# Patient Record
Sex: Female | Born: 1940
Health system: Southern US, Community
[De-identification: ages and names within clinical notes are randomized; demographics above are authoritative.]

## PROBLEM LIST (undated history)

## (undated) DIAGNOSIS — E042 Nontoxic multinodular goiter: Secondary | ICD-10-CM

## (undated) DIAGNOSIS — B009 Herpesviral infection, unspecified: Secondary | ICD-10-CM

## (undated) DIAGNOSIS — F329 Major depressive disorder, single episode, unspecified: Secondary | ICD-10-CM

## (undated) DIAGNOSIS — I00 Rheumatic fever without heart involvement: Secondary | ICD-10-CM

## (undated) DIAGNOSIS — D219 Benign neoplasm of connective and other soft tissue, unspecified: Secondary | ICD-10-CM

## (undated) DIAGNOSIS — F419 Anxiety disorder, unspecified: Secondary | ICD-10-CM

## (undated) DIAGNOSIS — E785 Hyperlipidemia, unspecified: Secondary | ICD-10-CM

## (undated) DIAGNOSIS — F32A Depression, unspecified: Secondary | ICD-10-CM

## (undated) DIAGNOSIS — M8580 Other specified disorders of bone density and structure, unspecified site: Secondary | ICD-10-CM

## (undated) HISTORY — PX: WRIST FRACTURE SURGERY: SHX121

## (undated) HISTORY — DX: Depression, unspecified: F32.A

## (undated) HISTORY — DX: Benign neoplasm of connective and other soft tissue, unspecified: D21.9

## (undated) HISTORY — PX: PELVIC LAPAROSCOPY: SHX162

## (undated) HISTORY — PX: CATARACT EXTRACTION: SUR2

## (undated) HISTORY — DX: Rheumatic fever without heart involvement: I00

## (undated) HISTORY — DX: Herpesviral infection, unspecified: B00.9

## (undated) HISTORY — DX: Other specified disorders of bone density and structure, unspecified site: M85.80

## (undated) HISTORY — PX: TONSILLECTOMY: SUR1361

## (undated) HISTORY — DX: Anxiety disorder, unspecified: F41.9

## (undated) HISTORY — DX: Major depressive disorder, single episode, unspecified: F32.9

## (undated) HISTORY — DX: Nontoxic multinodular goiter: E04.2

---

## 2000-04-25 ENCOUNTER — Encounter: Payer: Self-pay | Admitting: Internal Medicine

## 2000-04-25 ENCOUNTER — Ambulatory Visit (HOSPITAL_COMMUNITY): Admission: RE | Admit: 2000-04-25 | Discharge: 2000-04-25 | Payer: Self-pay | Admitting: Internal Medicine

## 2001-01-17 ENCOUNTER — Encounter: Admission: RE | Admit: 2001-01-17 | Discharge: 2001-02-15 | Payer: Self-pay | Admitting: Neurosurgery

## 2001-04-28 ENCOUNTER — Encounter: Payer: Self-pay | Admitting: Internal Medicine

## 2001-04-28 ENCOUNTER — Ambulatory Visit (HOSPITAL_COMMUNITY): Admission: RE | Admit: 2001-04-28 | Discharge: 2001-04-28 | Payer: Self-pay | Admitting: Internal Medicine

## 2002-04-30 ENCOUNTER — Ambulatory Visit (HOSPITAL_COMMUNITY): Admission: RE | Admit: 2002-04-30 | Discharge: 2002-04-30 | Payer: Self-pay | Admitting: Internal Medicine

## 2002-04-30 ENCOUNTER — Encounter: Payer: Self-pay | Admitting: Internal Medicine

## 2002-05-01 ENCOUNTER — Encounter: Payer: Self-pay | Admitting: Internal Medicine

## 2002-05-01 ENCOUNTER — Encounter: Admission: RE | Admit: 2002-05-01 | Discharge: 2002-05-01 | Payer: Self-pay | Admitting: Internal Medicine

## 2003-05-06 ENCOUNTER — Encounter: Admission: RE | Admit: 2003-05-06 | Discharge: 2003-05-06 | Payer: Self-pay | Admitting: Internal Medicine

## 2004-05-11 ENCOUNTER — Encounter: Admission: RE | Admit: 2004-05-11 | Discharge: 2004-05-11 | Payer: Self-pay | Admitting: Geriatric Medicine

## 2005-05-03 ENCOUNTER — Encounter: Admission: RE | Admit: 2005-05-03 | Discharge: 2005-05-03 | Payer: Self-pay | Admitting: Geriatric Medicine

## 2005-05-12 ENCOUNTER — Encounter: Admission: RE | Admit: 2005-05-12 | Discharge: 2005-05-12 | Payer: Self-pay | Admitting: Geriatric Medicine

## 2006-04-26 ENCOUNTER — Encounter: Admission: RE | Admit: 2006-04-26 | Discharge: 2006-04-26 | Payer: Self-pay | Admitting: Geriatric Medicine

## 2006-08-18 ENCOUNTER — Encounter: Admission: RE | Admit: 2006-08-18 | Discharge: 2006-08-18 | Payer: Self-pay | Admitting: Geriatric Medicine

## 2007-03-23 ENCOUNTER — Encounter: Admission: RE | Admit: 2007-03-23 | Discharge: 2007-03-23 | Payer: Self-pay | Admitting: Dermatology

## 2007-06-26 ENCOUNTER — Encounter: Admission: RE | Admit: 2007-06-26 | Discharge: 2007-06-26 | Payer: Self-pay | Admitting: Geriatric Medicine

## 2007-08-07 ENCOUNTER — Encounter: Admission: RE | Admit: 2007-08-07 | Discharge: 2007-08-07 | Payer: Self-pay | Admitting: Geriatric Medicine

## 2007-08-14 ENCOUNTER — Encounter: Admission: RE | Admit: 2007-08-14 | Discharge: 2007-08-14 | Payer: Self-pay | Admitting: Geriatric Medicine

## 2007-08-21 ENCOUNTER — Other Ambulatory Visit: Admission: RE | Admit: 2007-08-21 | Discharge: 2007-08-21 | Payer: Self-pay | Admitting: Obstetrics and Gynecology

## 2007-08-24 ENCOUNTER — Encounter: Admission: RE | Admit: 2007-08-24 | Discharge: 2007-08-24 | Payer: Self-pay | Admitting: Interventional Radiology

## 2007-10-25 ENCOUNTER — Encounter: Admission: RE | Admit: 2007-10-25 | Discharge: 2007-10-25 | Payer: Self-pay | Admitting: Interventional Radiology

## 2007-11-01 ENCOUNTER — Encounter: Admission: RE | Admit: 2007-11-01 | Discharge: 2007-11-01 | Payer: Self-pay | Admitting: Interventional Radiology

## 2007-11-21 ENCOUNTER — Encounter: Admission: RE | Admit: 2007-11-21 | Discharge: 2007-11-21 | Payer: Self-pay | Admitting: Interventional Radiology

## 2007-11-29 ENCOUNTER — Encounter: Admission: RE | Admit: 2007-11-29 | Discharge: 2007-11-29 | Payer: Self-pay | Admitting: Interventional Radiology

## 2007-12-13 ENCOUNTER — Encounter: Admission: RE | Admit: 2007-12-13 | Discharge: 2007-12-13 | Payer: Self-pay | Admitting: Interventional Radiology

## 2007-12-20 ENCOUNTER — Encounter: Admission: RE | Admit: 2007-12-20 | Discharge: 2007-12-20 | Payer: Self-pay | Admitting: Interventional Radiology

## 2008-01-09 ENCOUNTER — Encounter: Admission: RE | Admit: 2008-01-09 | Discharge: 2008-01-09 | Payer: Self-pay | Admitting: Interventional Radiology

## 2008-03-28 ENCOUNTER — Ambulatory Visit (HOSPITAL_COMMUNITY): Admission: RE | Admit: 2008-03-28 | Discharge: 2008-03-28 | Payer: Self-pay | Admitting: Obstetrics and Gynecology

## 2008-06-18 ENCOUNTER — Encounter: Admission: RE | Admit: 2008-06-18 | Discharge: 2008-06-18 | Payer: Self-pay | Admitting: Dermatology

## 2008-07-01 ENCOUNTER — Encounter: Admission: RE | Admit: 2008-07-01 | Discharge: 2008-07-01 | Payer: Self-pay | Admitting: Geriatric Medicine

## 2008-08-02 ENCOUNTER — Encounter: Admission: RE | Admit: 2008-08-02 | Discharge: 2008-08-02 | Payer: Self-pay | Admitting: Geriatric Medicine

## 2009-06-19 ENCOUNTER — Other Ambulatory Visit: Admission: RE | Admit: 2009-06-19 | Discharge: 2009-06-19 | Payer: Self-pay | Admitting: Obstetrics and Gynecology

## 2010-02-01 ENCOUNTER — Emergency Department (HOSPITAL_COMMUNITY): Admission: EM | Admit: 2010-02-01 | Discharge: 2010-02-01 | Payer: Self-pay | Admitting: Emergency Medicine

## 2010-02-03 ENCOUNTER — Ambulatory Visit (HOSPITAL_BASED_OUTPATIENT_CLINIC_OR_DEPARTMENT_OTHER): Admission: RE | Admit: 2010-02-03 | Discharge: 2010-02-03 | Payer: Self-pay | Admitting: Orthopedic Surgery

## 2010-03-29 ENCOUNTER — Encounter: Payer: Self-pay | Admitting: Geriatric Medicine

## 2010-05-19 LAB — POCT I-STAT, CHEM 8
Chloride: 106 mEq/L (ref 96–112)
Creatinine, Ser: 0.7 mg/dL (ref 0.4–1.2)
Glucose, Bld: 109 mg/dL — ABNORMAL HIGH (ref 70–99)
Hemoglobin: 14.3 g/dL (ref 12.0–15.0)
Potassium: 3.5 mEq/L (ref 3.5–5.1)

## 2010-07-06 ENCOUNTER — Other Ambulatory Visit: Payer: Self-pay | Admitting: Geriatric Medicine

## 2010-07-06 DIAGNOSIS — Z1231 Encounter for screening mammogram for malignant neoplasm of breast: Secondary | ICD-10-CM

## 2010-07-22 ENCOUNTER — Ambulatory Visit
Admission: RE | Admit: 2010-07-22 | Discharge: 2010-07-22 | Disposition: A | Discharge status: Home / Self Care | Payer: Medicare Other | Source: Ambulatory Visit | Attending: Geriatric Medicine | Admitting: Geriatric Medicine

## 2010-07-22 DIAGNOSIS — Z1231 Encounter for screening mammogram for malignant neoplasm of breast: Secondary | ICD-10-CM

## 2010-11-30 ENCOUNTER — Encounter (INDEPENDENT_AMBULATORY_CARE_PROVIDER_SITE_OTHER): Payer: Medicare Other | Admitting: Ophthalmology

## 2010-11-30 DIAGNOSIS — H2510 Age-related nuclear cataract, unspecified eye: Secondary | ICD-10-CM

## 2010-11-30 DIAGNOSIS — H33309 Unspecified retinal break, unspecified eye: Secondary | ICD-10-CM

## 2010-11-30 DIAGNOSIS — H43819 Vitreous degeneration, unspecified eye: Secondary | ICD-10-CM

## 2011-01-01 ENCOUNTER — Other Ambulatory Visit: Payer: Self-pay | Admitting: Geriatric Medicine

## 2011-01-01 DIAGNOSIS — E041 Nontoxic single thyroid nodule: Secondary | ICD-10-CM

## 2011-01-14 ENCOUNTER — Ambulatory Visit
Admission: RE | Admit: 2011-01-14 | Discharge: 2011-01-14 | Disposition: A | Discharge status: Home / Self Care | Payer: Medicare Other | Source: Ambulatory Visit | Attending: Geriatric Medicine | Admitting: Geriatric Medicine

## 2011-01-14 DIAGNOSIS — E041 Nontoxic single thyroid nodule: Secondary | ICD-10-CM

## 2011-06-14 ENCOUNTER — Other Ambulatory Visit: Payer: Self-pay | Admitting: Geriatric Medicine

## 2011-06-14 DIAGNOSIS — Z1231 Encounter for screening mammogram for malignant neoplasm of breast: Secondary | ICD-10-CM

## 2011-08-04 ENCOUNTER — Other Ambulatory Visit: Payer: Self-pay | Admitting: Geriatric Medicine

## 2011-08-04 ENCOUNTER — Ambulatory Visit
Admission: RE | Admit: 2011-08-04 | Discharge: 2011-08-04 | Disposition: A | Discharge status: Home / Self Care | Payer: Medicare Other | Source: Ambulatory Visit | Attending: Geriatric Medicine | Admitting: Geriatric Medicine

## 2011-08-04 DIAGNOSIS — Z1231 Encounter for screening mammogram for malignant neoplasm of breast: Secondary | ICD-10-CM

## 2011-09-01 ENCOUNTER — Ambulatory Visit
Admission: RE | Admit: 2011-09-01 | Discharge: 2011-09-01 | Disposition: A | Discharge status: Home / Self Care | Payer: Medicare Other | Source: Ambulatory Visit | Attending: Geriatric Medicine | Admitting: Geriatric Medicine

## 2011-09-01 DIAGNOSIS — Z1231 Encounter for screening mammogram for malignant neoplasm of breast: Secondary | ICD-10-CM

## 2011-11-29 ENCOUNTER — Encounter (INDEPENDENT_AMBULATORY_CARE_PROVIDER_SITE_OTHER): Payer: Medicare Other | Admitting: Ophthalmology

## 2011-11-30 ENCOUNTER — Encounter (INDEPENDENT_AMBULATORY_CARE_PROVIDER_SITE_OTHER): Payer: Medicare Other | Admitting: Ophthalmology

## 2011-12-20 ENCOUNTER — Encounter (INDEPENDENT_AMBULATORY_CARE_PROVIDER_SITE_OTHER): Payer: Medicare Other | Admitting: Ophthalmology

## 2011-12-20 DIAGNOSIS — H33309 Unspecified retinal break, unspecified eye: Secondary | ICD-10-CM

## 2011-12-20 DIAGNOSIS — H2510 Age-related nuclear cataract, unspecified eye: Secondary | ICD-10-CM

## 2011-12-20 DIAGNOSIS — H2700 Aphakia, unspecified eye: Secondary | ICD-10-CM

## 2011-12-20 DIAGNOSIS — H43819 Vitreous degeneration, unspecified eye: Secondary | ICD-10-CM

## 2012-06-14 ENCOUNTER — Encounter: Payer: Self-pay | Admitting: Certified Nurse Midwife

## 2012-06-15 ENCOUNTER — Encounter: Payer: Self-pay | Admitting: Certified Nurse Midwife

## 2012-06-15 ENCOUNTER — Ambulatory Visit (INDEPENDENT_AMBULATORY_CARE_PROVIDER_SITE_OTHER): Payer: Medicare Other | Admitting: Certified Nurse Midwife

## 2012-06-15 VITALS — BP 100/68 | Ht 64.5 in | Wt 150.0 lb

## 2012-06-15 DIAGNOSIS — Z01419 Encounter for gynecological examination (general) (routine) without abnormal findings: Secondary | ICD-10-CM

## 2012-06-15 NOTE — Progress Notes (Signed)
72 y.o. MarriedCaucasian female   [id number] here for annual exam. Menopausal no HRT.  Denies any vaginal bleeding.  Using Olive Oil for vaginal dryness, works well.  Sees PCP for aex and lab work, all normal per patient. Sister with breast cancer has completed all treatment, doing great.  Went to counseling regarding genetic screening with her, declines having it done.  Had colonoscopy last year all normal. No health issues today.  Taking family and visiting [place]!  Patient's last menstrual period was 03/08/1992.          Sexually active: yes  The current method of family planning is vasectomy.    Exercising: yes  yoga, walking, elliptical & weight training Last mammogram: 09-08-11 Last pap: 10/12 Last BMD: 4/13 Alcohol: 5 a week Tobacco:none   Health Maintenance  Topic Date Due  . Colonoscopy  01/22/1993  . Zostavax  01/23/2003  . Pneumococcal Polysaccharide Vaccine Age 44 And Over  01/23/2008  . Influenza Vaccine  11/06/2012  . Mammogram  08/31/2013  . Tetanus/tdap  03/08/2016    Family History  Problem Relation Age of Onset  . Cancer Sister     ductal invasive cancer  . Cancer Maternal Grandmother     colon  . Cancer Father     lung (smoker)  . Depression Sister   . Osteoporosis Sister     There is no problem list on file for this patient.   Past Medical History  Diagnosis Date  . Multiple thyroid nodules     followed by pcp  . HSV-2 (herpes simplex virus 2) infection   . Anxiety   . Depression   . Rheumatic fever     no cardiology issues  . Fibroid   . Osteopenia     Past Surgical History  Procedure Laterality Date  . Wrist fracture surgery      Allergies: Latex  Current Outpatient Prescriptions  Medication Sig Dispense Refill  . alendronate (FOSAMAX) 70 MG tablet Take 70 mg by mouth every 7 (seven) days. Take with a full glass of water on an empty stomach.      . ALPRAZolam (XANAX) 0.5 MG tablet as needed.       Marland Kitchen aspirin 81 MG tablet Take 81 mg by  mouth daily.      . Calcium Carbonate (CALCIUM 600 PO) Take by mouth 2 (two) times daily.      . Cholecalciferol (VITAMIN D PO) Take by mouth daily.      . Coenzyme Q10 (CO Q 10 PO) Take by mouth daily.      . Iodine, Kelp, (KELP PO) Take by mouth daily.      . Multiple Vitamins-Minerals (MULTIVITAMIN PO) Take by mouth daily.      . Probiotic Product (PROBIOTIC PO) Take by mouth daily.      . TURMERIC PO Take by mouth daily.       No current facility-administered medications for this visit.    ROS: Pertinent items are noted in HPI.  Exam:    BP 100/68  Ht 5' 4.5" (1.638 m)  Wt 150 lb (68.04 kg)  BMI 25.36 kg/m2  LMP 03/08/1992 Weight change: @WEIGHTCHANGE @ Last 3 height recordings:  Ht Readings from Last 3 Encounters:  06/15/12 5' 4.5" (1.638 m)   General appearance: alert and cooperative Head: Normocephalic, without obvious abnormality, atraumatic Neck: no adenopathy, supple, symmetrical, trachea midline and thyroid not enlarged, symmetric, no tenderness/mass/nodules Lungs: clear to auscultation bilaterally Breasts: normal appearance, no masses or tenderness, No  nipple retraction or dimpling Heart: regular rate and rhythm Abdomen: soft, non-tender; bowel sounds normal; no masses,  no organomegaly Extremities: extremities normal, atraumatic, no cyanosis or edema Skin: Skin color, texture, turgor normal. No rashes or lesions Lymph nodes: Cervical, supraclavicular, and axillary nodes normal. no inguinal nodes palpated Neurologic: Grossly normal   Pelvic: External genitalia:  no lesions              Urethra: not indicated and normal appearing urethra with no masses, tenderness or lesions              Bartholins and Skenes: Bartholin's, Urethra, Skene's normal                 Vagina: normal appearing vagina with normal color and discharge, no lesions, atrophic, moist              Cervix: normal appearance              Pap taken: no        Bimanual Exam:  Uterus:  uterus is  normal size, shape, consistency and nontender                                      Adnexa:    normal adnexa in size, nontender and no masses                                      Rectovaginal: Confirms                                      Anus:  normal sphincter tone, no lesions  A: Well woman Exam Menopausal no HRT Osteoporosis on Fosamax managed by PCP Atrophic vaginitis uses Olive Oil with good response Family history of breast cancer: sister      P: Reviewed health and wellness pertinent to exam  Mammogram yearly, Pap smear as indicated Continue PCP follow up as indicated Declines any genetic information, went with sister for counseling  return annually or prn      An After Visit Summary was printed and given to the patient.  Reviewed, TL

## 2012-06-15 NOTE — Patient Instructions (Signed)

## 2012-06-20 ENCOUNTER — Other Ambulatory Visit: Payer: Self-pay | Admitting: Geriatric Medicine

## 2012-06-20 DIAGNOSIS — E041 Nontoxic single thyroid nodule: Secondary | ICD-10-CM

## 2012-08-14 ENCOUNTER — Other Ambulatory Visit: Payer: Self-pay

## 2012-08-14 DIAGNOSIS — Z1231 Encounter for screening mammogram for malignant neoplasm of breast: Secondary | ICD-10-CM

## 2012-08-21 ENCOUNTER — Ambulatory Visit
Admission: RE | Admit: 2012-08-21 | Discharge: 2012-08-21 | Disposition: A | Discharge status: Home / Self Care | Payer: Medicare Other | Source: Ambulatory Visit | Attending: Geriatric Medicine | Admitting: Geriatric Medicine

## 2012-08-21 DIAGNOSIS — E041 Nontoxic single thyroid nodule: Secondary | ICD-10-CM

## 2012-09-11 ENCOUNTER — Ambulatory Visit
Admission: RE | Admit: 2012-09-11 | Discharge: 2012-09-11 | Disposition: A | Discharge status: Home / Self Care | Payer: Medicare Other | Source: Ambulatory Visit

## 2012-09-11 DIAGNOSIS — Z1231 Encounter for screening mammogram for malignant neoplasm of breast: Secondary | ICD-10-CM

## 2012-12-20 ENCOUNTER — Ambulatory Visit (INDEPENDENT_AMBULATORY_CARE_PROVIDER_SITE_OTHER): Payer: Medicare Other | Admitting: Ophthalmology

## 2012-12-20 DIAGNOSIS — H43819 Vitreous degeneration, unspecified eye: Secondary | ICD-10-CM

## 2012-12-20 DIAGNOSIS — H25049 Posterior subcapsular polar age-related cataract, unspecified eye: Secondary | ICD-10-CM

## 2012-12-20 DIAGNOSIS — H33309 Unspecified retinal break, unspecified eye: Secondary | ICD-10-CM

## 2013-06-18 ENCOUNTER — Ambulatory Visit: Payer: Medicare Other | Admitting: Certified Nurse Midwife

## 2013-06-18 ENCOUNTER — Encounter: Payer: Self-pay | Admitting: Certified Nurse Midwife

## 2013-07-03 ENCOUNTER — Telehealth: Payer: Self-pay | Admitting: Certified Nurse Midwife

## 2013-07-03 NOTE — Telephone Encounter (Signed)
Pt calling to let DL know she had her annual physical with Dr.Stoneking and was told she did not need to have a gyn visit until later on in the year.

## 2013-07-03 NOTE — Telephone Encounter (Signed)
Brooke [name] CNM, patient last seen 06/15/2012 for AEX. No annual scheduled so far for this year. Had annual physical with Dr.Stoneking and was told did not need to be seen by gyn until later in the year. How would you like me to proceed with patient?

## 2013-07-04 NOTE — Telephone Encounter (Signed)
We normally see a patient once yearly, I think it is up to patient if she wants to postpone to later this year.  We are not prescribing any medications at least from last note. If patient is not having any problems I think it would be acceptable.

## 2013-07-05 NOTE — Telephone Encounter (Signed)
Left message to call Ledarius Leeson at [phone]. 

## 2013-07-20 NOTE — Telephone Encounter (Signed)
Left message to call Unita Detamore at [phone]. 

## 2013-07-20 NOTE — Telephone Encounter (Signed)
Patient is returning a call to Brooke [name] . °

## 2013-07-20 NOTE — Telephone Encounter (Signed)
Spoke with patient. Patient states that she is not having any problems or concerns at this time and would like to wait to come in for AEX. Advised that this is fine per Regina Eck CNM but if anything changes feel free to give [place] a call back so that we can get her an appointment. Patient agreeable and verbalizes understanding.  Routing to provider for final review. Patient agreeable to disposition. Will close encounter

## 2013-10-11 ENCOUNTER — Other Ambulatory Visit: Payer: Self-pay | Admitting: Gastroenterology

## 2013-12-13 ENCOUNTER — Other Ambulatory Visit: Payer: Self-pay

## 2013-12-13 DIAGNOSIS — Z1239 Encounter for other screening for malignant neoplasm of breast: Secondary | ICD-10-CM

## 2013-12-20 ENCOUNTER — Encounter (HOSPITAL_COMMUNITY): Payer: Self-pay | Admitting: Pharmacy Technician

## 2013-12-21 ENCOUNTER — Other Ambulatory Visit: Payer: Self-pay | Admitting: Gastroenterology

## 2013-12-24 ENCOUNTER — Ambulatory Visit
Admission: RE | Admit: 2013-12-24 | Discharge: 2013-12-24 | Disposition: A | Discharge status: Home / Self Care | Payer: Medicare Other | Source: Ambulatory Visit

## 2013-12-24 DIAGNOSIS — Z1239 Encounter for other screening for malignant neoplasm of breast: Secondary | ICD-10-CM

## 2013-12-25 ENCOUNTER — Encounter (HOSPITAL_COMMUNITY): Payer: Self-pay | Admitting: *Deleted

## 2014-01-07 ENCOUNTER — Encounter (HOSPITAL_COMMUNITY): Payer: Self-pay | Admitting: *Deleted

## 2014-01-08 ENCOUNTER — Ambulatory Visit (HOSPITAL_COMMUNITY)
Admission: RE | Admit: 2014-01-08 | Discharge: 2014-01-08 | Disposition: A | Discharge status: Home / Self Care | Payer: Medicare Other | Source: Ambulatory Visit | Attending: Gastroenterology | Admitting: Gastroenterology

## 2014-01-08 ENCOUNTER — Encounter (HOSPITAL_COMMUNITY)
Admission: RE | Disposition: A | Discharge status: Home / Self Care | Payer: Self-pay | Source: Ambulatory Visit | Attending: Gastroenterology

## 2014-01-08 ENCOUNTER — Ambulatory Visit (HOSPITAL_COMMUNITY): Payer: Medicare Other | Admitting: Anesthesiology

## 2014-01-08 ENCOUNTER — Encounter (HOSPITAL_COMMUNITY): Payer: Self-pay

## 2014-01-08 DIAGNOSIS — K562 Volvulus: Secondary | ICD-10-CM | POA: Diagnosis not present

## 2014-01-08 DIAGNOSIS — F419 Anxiety disorder, unspecified: Secondary | ICD-10-CM | POA: Insufficient documentation

## 2014-01-08 DIAGNOSIS — E042 Nontoxic multinodular goiter: Secondary | ICD-10-CM | POA: Insufficient documentation

## 2014-01-08 DIAGNOSIS — Z5309 Procedure and treatment not carried out because of other contraindication: Secondary | ICD-10-CM | POA: Insufficient documentation

## 2014-01-08 DIAGNOSIS — Z91040 Latex allergy status: Secondary | ICD-10-CM | POA: Insufficient documentation

## 2014-01-08 DIAGNOSIS — M810 Age-related osteoporosis without current pathological fracture: Secondary | ICD-10-CM | POA: Diagnosis not present

## 2014-01-08 DIAGNOSIS — E780 Pure hypercholesterolemia: Secondary | ICD-10-CM | POA: Insufficient documentation

## 2014-01-08 DIAGNOSIS — F329 Major depressive disorder, single episode, unspecified: Secondary | ICD-10-CM | POA: Insufficient documentation

## 2014-01-08 DIAGNOSIS — Z1211 Encounter for screening for malignant neoplasm of colon: Secondary | ICD-10-CM | POA: Insufficient documentation

## 2014-01-08 DIAGNOSIS — G4700 Insomnia, unspecified: Secondary | ICD-10-CM | POA: Diagnosis not present

## 2014-01-08 DIAGNOSIS — Z888 Allergy status to other drugs, medicaments and biological substances status: Secondary | ICD-10-CM | POA: Diagnosis not present

## 2014-01-08 HISTORY — PX: COLONOSCOPY WITH PROPOFOL: SHX5780

## 2014-01-08 SURGERY — COLONOSCOPY WITH PROPOFOL
Anesthesia: Monitor Anesthesia Care

## 2014-01-08 MED ORDER — LACTATED RINGERS IV SOLN
INTRAVENOUS | Status: DC
Start: 2014-01-08 — End: 2014-01-08
  Administered 2014-01-08: 1000 mL via INTRAVENOUS

## 2014-01-08 MED ORDER — PROPOFOL INFUSION 10 MG/ML OPTIME
INTRAVENOUS | Status: DC | PRN
  Administered 2014-01-08: 200 ug/kg/min via INTRAVENOUS

## 2014-01-08 MED ORDER — SODIUM CHLORIDE 0.9 % IV SOLN
INTRAVENOUS | Status: DC
Start: 2014-01-08 — End: 2014-01-08

## 2014-01-08 MED ORDER — PROPOFOL 10 MG/ML IV BOLUS
INTRAVENOUS | Status: AC
  Filled 2014-01-08: qty 20

## 2014-01-08 SURGICAL SUPPLY — 22 items

## 2014-01-08 NOTE — Anesthesia Postprocedure Evaluation (Signed)
  Anesthesia Post-op Note  Patient: Brooke [name]  Procedure(s) Performed: Procedure(s): COLONOSCOPY WITH PROPOFOL (N/A)  Patient Location: PACU  Anesthesia Type:MAC  Level of Consciousness: awake and alert   Airway and Oxygen Therapy: Patient Spontanous Breathing  Post-op Pain: none  Post-op Assessment: Post-op Vital signs reviewed, Patient's Cardiovascular Status Stable, Respiratory Function Stable, Patent Airway, No signs of Nausea or vomiting and Pain level controlled  Post-op Vital Signs: Reviewed and stable  Last Vitals:  Filed Vitals:   01/08/14 1040  BP:   Pulse: 71  Temp:   Resp: 13    Complications: No apparent anesthesia complications

## 2014-01-08 NOTE — H&P (Signed)
  Procedure: Screening colonoscopy. Normal screening colonoscopies performed on 04/10/2002 and 02/11/1995  History: The patient is a 73 year old female born June 04, 1940. She is scheduled to undergo a screening colonoscopy with polypectomy to prevent colon cancer.  Past medical history: Left wrist fracture surgery. Right eye cataract surgery. Repair of retinal hole. Tonsillectomy. Laparoscopic surgery for endometriosis. Insomnia. Ovarian cyst. Stable thyroid nodules. Hypercholesterolemia. Osteoporosis.  Allergies: Latex. Adhesive bandages.  Exam: The patient is alert and lying comfortably on the endoscopy stretcher. Abdomen is soft and nontender to palpation. Lungs are clear to auscultation. Cardiac exam reveals a regular rhythm.  Plan: Proceed with screening colonoscopy

## 2014-01-08 NOTE — Op Note (Signed)
Procedure: Screening flexible proctosigmoidoscopy to the splenic flexure. Colonic loop formation prevented completion of a screening colonoscopy.  Endoscopist: Earle Gell  Premedication: Propofol administered by anesthesia  Procedure: The patient was placed in the left lateral decubitus position. Anal inspection and digital rectal exam were normal. The Pentax pediatric colonoscope was introduced into the rectum and advanced to approximately the splenic flexure. Due to colonic loop formation I was unable to traverse the splenic flexure and complete a screening colonoscopy. The patient was repositioned from the left lateral decubitus position to the supine position and finally to the right lateral decubitus position applying external abdominal pressure which did not allow advancement of the colonoscope around the splenic flexure. Colonic preparation for the exam today was good.  Rectum. Normal. Retroflexed view of the distal rectum normal.  Sigmoid colon. Normal  Descending colon. Normal  The splenic flexure, transverse colon, hepatic flexure, ascending colon, cecum, and ileocecal valve were not examined.  Assessment: Normal screening flexible proctosigmoidoscopy  Recommendation: Schedule screening virtual colonoscopy

## 2014-01-08 NOTE — Anesthesia Preprocedure Evaluation (Signed)
Anesthesia Evaluation  Patient identified by MRN, date of birth, ID band Patient awake    Reviewed: Allergy & Precautions, H&P , NPO status   History of Anesthesia Complications Negative for: history of anesthetic complications  Airway Mallampati: I  TM Distance: >3 FB Neck ROM: Full    Dental  (+) Teeth Intact   Pulmonary neg pulmonary ROS,  breath sounds clear to auscultation        Cardiovascular negative cardio ROS  Rhythm:Regular     Neuro/Psych PSYCHIATRIC DISORDERS Anxiety Depression negative neurological ROS     GI/Hepatic negative GI ROS, Neg liver ROS,   Endo/Other  negative endocrine ROS  Renal/GU negative Renal ROS     Musculoskeletal   Abdominal   Peds  Hematology negative hematology ROS (+)   Anesthesia Other Findings   Reproductive/Obstetrics                             Anesthesia Physical Anesthesia Plan  ASA: II  Anesthesia Plan: MAC   Post-op Pain Management:    Induction: Intravenous  Airway Management Planned: Natural Airway and Simple Face Mask  Additional Equipment:   Intra-op Plan:   Post-operative Plan:   Informed Consent: I have reviewed the patients History and Physical, chart, labs and discussed the procedure including the risks, benefits and alternatives for the proposed anesthesia with the patient or authorized representative who has indicated his/her understanding and acceptance.   Dental advisory given  Plan Discussed with: CRNA and Surgeon  Anesthesia Plan Comments:         Anesthesia Quick Evaluation

## 2014-01-08 NOTE — Discharge Instructions (Signed)

## 2014-01-08 NOTE — Transfer of Care (Signed)
Immediate Anesthesia Transfer of Care Note  Patient: Brooke [name]  Procedure(s) Performed: Procedure(s): COLONOSCOPY WITH PROPOFOL (N/A)  Patient Location: PACU  Anesthesia Type:MAC  Level of Consciousness: awake  Airway & Oxygen Therapy: Patient Spontanous Breathing and Patient connected to nasal cannula oxygen  Post-op Assessment: Report given to PACU RN and Post -op Vital signs reviewed and stable  Post vital signs: Reviewed and stable  Complications: No apparent anesthesia complications

## 2014-01-09 ENCOUNTER — Ambulatory Visit (INDEPENDENT_AMBULATORY_CARE_PROVIDER_SITE_OTHER): Payer: Medicare Other | Admitting: Ophthalmology

## 2014-01-09 DIAGNOSIS — H43813 Vitreous degeneration, bilateral: Secondary | ICD-10-CM

## 2014-01-09 DIAGNOSIS — H33303 Unspecified retinal break, bilateral: Secondary | ICD-10-CM

## 2014-01-10 ENCOUNTER — Encounter (HOSPITAL_COMMUNITY): Payer: Self-pay | Admitting: Gastroenterology

## 2014-03-05 ENCOUNTER — Encounter (HOSPITAL_COMMUNITY): Payer: Self-pay | Admitting: Gastroenterology

## 2014-11-25 ENCOUNTER — Other Ambulatory Visit: Payer: Self-pay

## 2014-11-25 DIAGNOSIS — Z1231 Encounter for screening mammogram for malignant neoplasm of breast: Secondary | ICD-10-CM

## 2014-12-30 ENCOUNTER — Ambulatory Visit
Admission: RE | Admit: 2014-12-30 | Discharge: 2014-12-30 | Disposition: A | Discharge status: Home / Self Care | Payer: Medicare Other | Source: Ambulatory Visit

## 2014-12-30 DIAGNOSIS — Z1231 Encounter for screening mammogram for malignant neoplasm of breast: Secondary | ICD-10-CM

## 2015-01-27 ENCOUNTER — Ambulatory Visit
Admission: RE | Admit: 2015-01-27 | Discharge: 2015-01-27 | Disposition: A | Discharge status: Home / Self Care | Payer: Medicare Other | Source: Ambulatory Visit | Attending: Geriatric Medicine | Admitting: Geriatric Medicine

## 2015-01-27 ENCOUNTER — Other Ambulatory Visit: Payer: Self-pay | Admitting: Geriatric Medicine

## 2015-01-27 DIAGNOSIS — R059 Cough, unspecified: Secondary | ICD-10-CM

## 2015-01-27 DIAGNOSIS — R05 Cough: Secondary | ICD-10-CM

## 2015-02-05 ENCOUNTER — Ambulatory Visit (INDEPENDENT_AMBULATORY_CARE_PROVIDER_SITE_OTHER): Payer: Medicare Other | Admitting: Ophthalmology

## 2015-04-03 ENCOUNTER — Ambulatory Visit (INDEPENDENT_AMBULATORY_CARE_PROVIDER_SITE_OTHER): Payer: Medicare Other | Admitting: Ophthalmology

## 2015-04-03 DIAGNOSIS — H43813 Vitreous degeneration, bilateral: Secondary | ICD-10-CM

## 2015-04-03 DIAGNOSIS — H33303 Unspecified retinal break, bilateral: Secondary | ICD-10-CM | POA: Diagnosis not present

## 2015-04-03 DIAGNOSIS — H2512 Age-related nuclear cataract, left eye: Secondary | ICD-10-CM

## 2015-12-26 ENCOUNTER — Other Ambulatory Visit: Payer: Self-pay | Admitting: Geriatric Medicine

## 2015-12-26 DIAGNOSIS — Z1231 Encounter for screening mammogram for malignant neoplasm of breast: Secondary | ICD-10-CM

## 2016-02-02 ENCOUNTER — Encounter (INDEPENDENT_AMBULATORY_CARE_PROVIDER_SITE_OTHER): Payer: Self-pay

## 2016-02-02 ENCOUNTER — Ambulatory Visit
Admission: RE | Admit: 2016-02-02 | Discharge: 2016-02-02 | Disposition: A | Discharge status: Home / Self Care | Payer: Medicare Other | Source: Ambulatory Visit | Attending: Geriatric Medicine | Admitting: Geriatric Medicine

## 2016-02-02 DIAGNOSIS — Z1231 Encounter for screening mammogram for malignant neoplasm of breast: Secondary | ICD-10-CM

## 2016-04-08 ENCOUNTER — Ambulatory Visit (INDEPENDENT_AMBULATORY_CARE_PROVIDER_SITE_OTHER): Payer: Medicare Other | Admitting: Ophthalmology

## 2016-04-09 ENCOUNTER — Ambulatory Visit (INDEPENDENT_AMBULATORY_CARE_PROVIDER_SITE_OTHER): Payer: Medicare Other | Admitting: Ophthalmology

## 2016-04-26 ENCOUNTER — Ambulatory Visit (INDEPENDENT_AMBULATORY_CARE_PROVIDER_SITE_OTHER): Payer: Medicare Other | Admitting: Ophthalmology

## 2016-04-26 DIAGNOSIS — H43813 Vitreous degeneration, bilateral: Secondary | ICD-10-CM

## 2016-04-26 DIAGNOSIS — H33313 Horseshoe tear of retina without detachment, bilateral: Secondary | ICD-10-CM | POA: Diagnosis not present

## 2016-04-26 DIAGNOSIS — H35371 Puckering of macula, right eye: Secondary | ICD-10-CM

## 2016-05-27 ENCOUNTER — Ambulatory Visit: Payer: Medicare Other | Admitting: Certified Nurse Midwife

## 2016-06-02 ENCOUNTER — Ambulatory Visit: Payer: Medicare Other | Admitting: Certified Nurse Midwife

## 2016-12-30 ENCOUNTER — Other Ambulatory Visit: Payer: Self-pay | Admitting: Geriatric Medicine

## 2016-12-30 DIAGNOSIS — Z139 Encounter for screening, unspecified: Secondary | ICD-10-CM

## 2017-02-07 ENCOUNTER — Ambulatory Visit
Admission: RE | Admit: 2017-02-07 | Discharge: 2017-02-07 | Disposition: A | Discharge status: Home / Self Care | Payer: Medicare Other | Source: Ambulatory Visit | Attending: Geriatric Medicine | Admitting: Geriatric Medicine

## 2017-02-07 DIAGNOSIS — Z139 Encounter for screening, unspecified: Secondary | ICD-10-CM

## 2017-05-02 ENCOUNTER — Ambulatory Visit (INDEPENDENT_AMBULATORY_CARE_PROVIDER_SITE_OTHER): Payer: Medicare Other | Admitting: Ophthalmology

## 2017-05-16 ENCOUNTER — Encounter (INDEPENDENT_AMBULATORY_CARE_PROVIDER_SITE_OTHER): Payer: Medicare Other | Admitting: Ophthalmology

## 2017-05-16 DIAGNOSIS — H43813 Vitreous degeneration, bilateral: Secondary | ICD-10-CM | POA: Diagnosis not present

## 2017-05-16 DIAGNOSIS — H33303 Unspecified retinal break, bilateral: Secondary | ICD-10-CM | POA: Diagnosis not present

## 2017-05-16 DIAGNOSIS — H35371 Puckering of macula, right eye: Secondary | ICD-10-CM

## 2017-08-08 NOTE — Progress Notes (Signed)
[name] [address], [place] [address] Phone: [phone] Subjective:    I'm seeing this patient by the request  of:  Stoneking, Hal, MD   CC: right hip pain   [id number]  Brooke [name] is a 77 y.o. female coming in with complaint of right hip pain.She has been having chronic pain in her hip. Was diagnosed with low back pain by another provider. She does yoga 6 days a week. She feels that pigeon stretch helps her pain. She gets massage and does self pressure point relief with a ball. Denies any radiating symptoms. Pain is intermittent. Occurs mostly with transitions: getting up from chair, out of bed. Patient does have history of sciatic nerve pain 20 years ago. Patient does not remember any specific injury.      Past Medical History:  Diagnosis Date  . Anxiety   . Depression    no history of.  . Fibroid   . HSV-2 (herpes simplex virus 2) infection   . Multiple thyroid nodules    followed by pcp  . Osteopenia   . Rheumatic fever    no cardiology issues   Past Surgical History:  Procedure Laterality Date  . CATARACT EXTRACTION Right   . COLONOSCOPY WITH PROPOFOL N/A 01/08/2014   Procedure: COLONOSCOPY WITH PROPOFOL;  Surgeon: Garlan Fair, MD;  Location: [facility];  Service: Endoscopy;  Laterality: N/A;  . PELVIC LAPAROSCOPY     [name]  . TONSILLECTOMY    . WRIST FRACTURE SURGERY Left    '11-hardware retained   Social History   Socioeconomic History  . Marital status: Married    Spouse name: Not on file  . Number of children: Not on file  . Years of education: Not on file  . Highest education level: Not on file  Occupational History  . Not on file  Social Needs  . Financial resource strain: Not on file  . Food insecurity:    Worry: Not on file    Inability: Not on file  . Transportation needs:    Medical: Not on file    Non-medical: Not on file  Tobacco Use  . Smoking status: Never Smoker  Substance  and Sexual Activity  . Alcohol use: Yes    Alcohol/week: 2.5 oz    Types: 5 drink(s) per week    Comment: 2 wine glasses per day.  . Drug use: No  . Sexual activity: Yes    Partners: Male    Comment: husband vasectomy  Lifestyle  . Physical activity:    Days per week: Not on file    Minutes per session: Not on file  . Stress: Not on file  Relationships  . Social connections:    Talks on phone: Not on file    Gets together: Not on file    Attends religious service: Not on file    Active member of club or organization: Not on file    Attends meetings of clubs or organizations: Not on file    Relationship status: Not on file  Other Topics Concern  . Not on file  Social History Narrative  . Not on file   Allergies  Allergen Reactions  . Latex     Long period ... Red skin    Family History  Problem Relation Age of Onset  . Cancer Sister        ductal invasive cancer  . Breast cancer Sister 14  . Cancer Maternal  Grandmother        colon  . Cancer Father        lung (smoker)  . Depression Sister   . Osteoporosis Sister      Past medical history, social, surgical and family history all reviewed in electronic medical record.  No pertanent information unless stated regarding to the chief complaint.   Review of Systems:Review of systems updated and as accurate as of 08/09/17  No headache, visual changes, nausea, vomiting, diarrhea, constipation, dizziness, abdominal pain, skin rash, fevers, chills, night sweats, weight loss, swollen lymph nodes, body aches, joint swelling,  chest pain, shortness of breath, mood changes.  Mild positive muscle aches  Objective  Blood pressure 126/76, pulse 80, height 5\' 4"  (1.626 m), weight 137 lb (62.1 kg), last menstrual period 03/08/1992, SpO2 98 %. Systems examined below as of 08/09/17   General: No apparent distress alert and oriented x3 mood and affect normal, dressed appropriately.  HEENT: Pupils equal, extraocular movements intact    Respiratory: Patient's speak in full sentences and does not appear short of breath  Cardiovascular: No lower extremity edema, non tender, no erythema  Skin: Warm dry intact with no signs of infection or rash on extremities or on axial skeleton.  Abdomen: Soft nontender  Neuro: Cranial nerves II through XII are intact, neurovascularly intact in all extremities with 2+ DTRs and 2+ pulses.  Lymph: No lymphadenopathy of posterior or anterior cervical chain or axillae bilaterally.  Gait normal with good balance and coordination.  MSK:  Non tender with full range of motion and good stability and symmetric strength and tone of shoulders, elbows, wrist, hip, knee and ankles bilaterally.  Back Exam:  Inspection: Mild loss of lordosis Motion: Flexion 40 deg, Extension 25 deg, Side Bending to 35 deg bilaterally,  Rotation to 35 deg bilaterally  SLR laying: Negative  XSLR laying: Negative  Palpable tenderness: Tender to palpation in the paraspinal musculature lumbar spine. FABER: Positive Faber. Sensory change: Gross sensation intact to all lumbar and sacral dermatomes.  Reflexes: 2+ at both patellar tendons, 2+ at achilles tendons, Babinski's downgoing.  Strength at foot  Plantar-flexion: 5/5 Dorsi-flexion: 5/5 Eversion: 5/5 Inversion: 5/5  Leg strength  Quad: 5/5 Hamstring: 5/5 Hip flexor: 5/5 Hip abductors: 5/5  Gait unremarkable.   [id number]; 15 additional minutes spent for Therapeutic exercises as stated in above notes.  This included exercises focusing on stretching, strengthening, with significant focus on eccentric aspects.   Long term goals include an improvement in range of motion, strength, endurance as well as avoiding reinjury. Patient's frequency would include in 1-2 times a day, 3-5 times a week for a duration of 6-12 weeks. Sacroiliac Joint Mobilization and Rehab 1. Work on pretzel stretching, shoulder back and leg draped in front. 3-5 sets, 30 sec.. 2. hip abductor rotations. standing,  hip flexion and rotation outward then inward. 3 sets, 15 reps. when can do comfortably, add ankle weights starting at 2 pounds.  3. cross over stretching - shoulder back to ground, same side leg crossover. 3-5 sets for 30 min..  4. rolling up and back knees to chest and rocking. 5. sacral tilt - 5 sets, hold for 5-10 seconds  Proper technique shown and discussed handout in great detail with ATC.  All questions were discussed and answered.     Impression and Recommendations:     This case required medical decision making of moderate complexity.      Note: This dictation was prepared with Dragon dictation along with  smaller phrase technology. Any transcriptional errors that result from this process are unintentional.

## 2017-08-09 ENCOUNTER — Encounter

## 2017-08-09 ENCOUNTER — Ambulatory Visit: Payer: Medicare Other | Admitting: Family Medicine

## 2017-08-09 ENCOUNTER — Ambulatory Visit (INDEPENDENT_AMBULATORY_CARE_PROVIDER_SITE_OTHER)
Admission: RE | Admit: 2017-08-09 | Discharge: 2017-08-09 | Disposition: A | Discharge status: Home / Self Care | Payer: Medicare Other | Source: Ambulatory Visit | Attending: Family Medicine | Admitting: Family Medicine

## 2017-08-09 VITALS — BP 126/76 | HR 80 | Ht 64.0 in | Wt 137.0 lb

## 2017-08-09 DIAGNOSIS — M25551 Pain in right hip: Secondary | ICD-10-CM

## 2017-08-09 DIAGNOSIS — M533 Sacrococcygeal disorders, not elsewhere classified: Secondary | ICD-10-CM | POA: Insufficient documentation

## 2017-08-09 NOTE — Patient Instructions (Signed)
Good to see you  Ice 20 minutes 2 times daily. Usually after activity and before bed. pennsaid pinkie amount topically 2 times daily as needed. Or your voltaren gel  Xray downstairs.  Over the counter  Vitamin D 2000 Iu daily  Turmeric 500mg  daily  Tart cherry extract any dose at night Exercises 3 times a week.   Decrease the yoga to 4 times a week  OK to lift but only machines and 50% of weight you think you should do and increase 10% a week See me again in 4 weeks

## 2017-08-09 NOTE — Assessment & Plan Note (Signed)
Sacroiliac dysfunction.  Discussed icing regimen and home exercises.  Discussed which activities of doing which wants to avoid patient will do more hip abductor strengthening.  I do think that there is some underlying performance that is contributing as well.  No radicular symptoms.  Patient will start with conservative therapy including home exercises and topical anti-inflammatories and over-the-counter medications.  X-rays pending.  Follow-up again in 4 weeks

## 2017-09-03 NOTE — Progress Notes (Signed)
[name] [address], [place] [address] Phone: [phone] Subjective:     CC: Hip pain follow-up  SVX:[id number]  Brooke [name] is a 77 y.o. female coming in with complaint of hip pain. She is doing much better than last visit. Occasionally has pain in the morning when she arises. She has been doing her exercises. Does mention that she receives Prolia injections in case we did not know.  Patient is doing significantly better at this point.  Would state that she is 85 to 90% better.  Not having any significant pain.  Doing the exercises occasionally and feels like they have helped out.     Past Medical History:  Diagnosis Date  . Anxiety   . Depression    no history of.  . Fibroid   . HSV-2 (herpes simplex virus 2) infection   . Multiple thyroid nodules    followed by pcp  . Osteopenia   . Rheumatic fever    no cardiology issues   Past Surgical History:  Procedure Laterality Date  . CATARACT EXTRACTION Right   . COLONOSCOPY WITH PROPOFOL N/A 01/08/2014   Procedure: COLONOSCOPY WITH PROPOFOL;  Surgeon: Garlan Fair, MD;  Location: [facility];  Service: Endoscopy;  Laterality: N/A;  . PELVIC LAPAROSCOPY     [name]  . TONSILLECTOMY    . WRIST FRACTURE SURGERY Left    '11-hardware retained   Social History   Socioeconomic History  . Marital status: Married    Spouse name: Not on file  . Number of children: Not on file  . Years of education: Not on file  . Highest education level: Not on file  Occupational History  . Not on file  Social Needs  . Financial resource strain: Not on file  . Food insecurity:    Worry: Not on file    Inability: Not on file  . Transportation needs:    Medical: Not on file    Non-medical: Not on file  Tobacco Use  . Smoking status: Never Smoker  Substance and Sexual Activity  . Alcohol use: Yes    Alcohol/week: 3.0 oz    Types: 5 drink(s) per week    Comment: 2 wine glasses per day.    . Drug use: No  . Sexual activity: Yes    Partners: Male    Comment: husband vasectomy  Lifestyle  . Physical activity:    Days per week: Not on file    Minutes per session: Not on file  . Stress: Not on file  Relationships  . Social connections:    Talks on phone: Not on file    Gets together: Not on file    Attends religious service: Not on file    Active member of club or organization: Not on file    Attends meetings of clubs or organizations: Not on file    Relationship status: Not on file  Other Topics Concern  . Not on file  Social History Narrative  . Not on file   Allergies  Allergen Reactions  . Latex     Long period ... Red skin    Family History  Problem Relation Age of Onset  . Cancer Sister        ductal invasive cancer  . Breast cancer Sister 38  . Cancer Maternal Grandmother        colon  . Cancer Father        lung (smoker)  .  Depression Sister   . Osteoporosis Sister      Past medical history, social, surgical and family history all reviewed in electronic medical record.  No pertanent information unless stated regarding to the chief complaint.   Review of Systems:Review of systems updated and as accurate as of 09/03/17  No headache, visual changes, nausea, vomiting, diarrhea, constipation, dizziness, abdominal pain, skin rash, fevers, chills, night sweats, weight loss, swollen lymph nodes, body aches, joint swelling, muscle aches, chest pain, shortness of breath, mood changes.   Objective  Last menstrual period 03/08/1992. Systems examined below as of 09/03/17   General: No apparent distress alert and oriented x3 mood and affect normal, dressed appropriately.  HEENT: Pupils equal, extraocular movements intact  Respiratory: Patient's speak in full sentences and does not appear short of breath  Cardiovascular: No lower extremity edema, non tender, no erythema  Skin: Warm dry intact with no signs of infection or rash on extremities or on axial  skeleton.  Abdomen: Soft nontender  Neuro: Cranial nerves II through XII are intact, neurovascularly intact in all extremities with 2+ DTRs and 2+ pulses.  Lymph: No lymphadenopathy of posterior or anterior cervical chain or axillae bilaterally.  Gait normal with good balance and coordination.   MSK:  Non tender with full range of motion and good stability and symmetric strength and tone of shoulders, elbows, wrist, hip, knee and ankles bilaterally.  Mild to moderate arthritic changes of multiple joints  Back Exam:  Inspection: Loss of lordosis Motion: Flexion 45 deg, Extension 25 deg, Side Bending to 35 deg bilaterally,  Rotation to 35 deg bilaterally  SLR laying: Negative  XSLR laying: Negative  Palpable tenderness: Tender to palpation.  Mostly over the right SI joint FABER: Positive Corky Sox on the right. Sensory change: Gross sensation intact to all lumbar and sacral dermatomes.  Reflexes: 2+ at both patellar tendons, 2+ at achilles tendons, Babinski's downgoing.  Strength at foot  Plantar-flexion: 5/5 Dorsi-flexion: 5/5 Eversion: 5/5 Inversion: 5/5  Leg strength  Quad: 5/5 Hamstring: 5/5 Hip flexor: 5/5 Hip abductors: 4/5  Gait unremarkable.     Impression and Recommendations:     This case required medical decision making of moderate complexity.      Note: This dictation was prepared with Dragon dictation along with smaller phrase technology. Any transcriptional errors that result from this process are unintentional.

## 2017-09-05 ENCOUNTER — Ambulatory Visit: Payer: Medicare Other | Admitting: Family Medicine

## 2017-09-05 DIAGNOSIS — M533 Sacrococcygeal disorders, not elsewhere classified: Secondary | ICD-10-CM

## 2017-09-05 NOTE — Assessment & Plan Note (Signed)
Significant improvement at this time.  Discussed icing regimen and home exercise.  Discussed continuing the exercises as tolerated.  Follow-up with me as needed.  X-rays show very mild arthritic changes

## 2018-04-17 ENCOUNTER — Other Ambulatory Visit: Payer: Self-pay | Admitting: Geriatric Medicine

## 2018-04-17 DIAGNOSIS — Z1231 Encounter for screening mammogram for malignant neoplasm of breast: Secondary | ICD-10-CM

## 2018-05-10 ENCOUNTER — Encounter (INDEPENDENT_AMBULATORY_CARE_PROVIDER_SITE_OTHER): Payer: Self-pay

## 2018-05-10 ENCOUNTER — Ambulatory Visit
Admission: RE | Admit: 2018-05-10 | Discharge: 2018-05-10 | Disposition: A | Discharge status: Home / Self Care | Payer: Medicare Other | Source: Ambulatory Visit | Attending: Geriatric Medicine | Admitting: Geriatric Medicine

## 2018-05-10 DIAGNOSIS — Z1231 Encounter for screening mammogram for malignant neoplasm of breast: Secondary | ICD-10-CM

## 2018-05-22 ENCOUNTER — Encounter (INDEPENDENT_AMBULATORY_CARE_PROVIDER_SITE_OTHER): Payer: Medicare Other | Admitting: Ophthalmology

## 2018-07-13 ENCOUNTER — Telehealth: Payer: Self-pay

## 2018-07-13 NOTE — Telephone Encounter (Signed)
Scheduled patient for next week.

## 2018-07-13 NOTE — Telephone Encounter (Signed)
Left message for patient to call back to schedule for shoulder pain.

## 2018-07-20 ENCOUNTER — Ambulatory Visit: Payer: Self-pay

## 2018-07-20 ENCOUNTER — Ambulatory Visit (INDEPENDENT_AMBULATORY_CARE_PROVIDER_SITE_OTHER): Payer: Medicare Other | Admitting: Family Medicine

## 2018-07-20 VITALS — BP 122/64 | HR 87 | Ht 64.0 in

## 2018-07-20 DIAGNOSIS — M25512 Pain in left shoulder: Secondary | ICD-10-CM | POA: Diagnosis not present

## 2018-07-20 DIAGNOSIS — M24112 Other articular cartilage disorders, left shoulder: Secondary | ICD-10-CM

## 2018-07-20 DIAGNOSIS — G8929 Other chronic pain: Secondary | ICD-10-CM

## 2018-07-20 NOTE — Progress Notes (Signed)
[name] [address] [place], [place] [address] Phone: [phone] Subjective:   [name], am serving as a scribe for Dr. Hulan Saas.   CC: Left shoulder pain  [id number]  Brooke [name] is a 78 y.o. female coming in with complaint of left shoulder pain since falling off of the toilet at 4am on 05/16/2018.  Patient states that she has pain in posterior shoulder with circumduction and flexion. Does practice yoga has pain with shoulder rolls. Denies any radiating symptoms. Does not use NSAIDs for pain.  Patient has been doing relatively well but can be very uncomfortable at night and certain movements.  Patient denies any significant radiation down the arm or any significant neck pain associated with it.  Patient did have outside x-rays.  Independently visualized by me showing very mild osteoarthritic changes of both the glenohumeral and the acromioclavicular joint.     Past Medical History:  Diagnosis Date  . Anxiety   . Depression    no history of.  . Fibroid   . HSV-2 (herpes simplex virus 2) infection   . Multiple thyroid nodules    followed by pcp  . Osteopenia   . Rheumatic fever    no cardiology issues   Past Surgical History:  Procedure Laterality Date  . CATARACT EXTRACTION Right   . COLONOSCOPY WITH PROPOFOL N/A 01/08/2014   Procedure: COLONOSCOPY WITH PROPOFOL;  Surgeon: Garlan Fair, MD;  Location: [facility];  Service: Endoscopy;  Laterality: N/A;  . PELVIC LAPAROSCOPY     [name]  . TONSILLECTOMY    . WRIST FRACTURE SURGERY Left    '11-hardware retained   Social History   Socioeconomic History  . Marital status: Divorced    Spouse name: Not on file  . Number of children: Not on file  . Years of education: Not on file  . Highest education level: Not on file  Occupational History  . Not on file  Social Needs  . Financial resource strain: Not on file  . Food insecurity:    Worry: Not on file   Inability: Not on file  . Transportation needs:    Medical: Not on file    Non-medical: Not on file  Tobacco Use  . Smoking status: Never Smoker  Substance and Sexual Activity  . Alcohol use: Yes    Alcohol/week: 5.0 standard drinks    Types: 5 drink(s) per week    Comment: 2 wine glasses per day.  . Drug use: No  . Sexual activity: Yes    Partners: Male    Comment: husband vasectomy  Lifestyle  . Physical activity:    Days per week: Not on file    Minutes per session: Not on file  . Stress: Not on file  Relationships  . Social connections:    Talks on phone: Not on file    Gets together: Not on file    Attends religious service: Not on file    Active member of club or organization: Not on file    Attends meetings of clubs or organizations: Not on file    Relationship status: Not on file  Other Topics Concern  . Not on file  Social History Narrative  . Not on file   Allergies  Allergen Reactions  . Latex     Long period ... Red skin    Family History  Problem Relation Age of Onset  . Cancer Sister  ductal invasive cancer  . Breast cancer Sister 12  . Cancer Maternal Grandmother        colon  . Cancer Father        lung (smoker)  . Depression Sister   . Osteoporosis Sister     Current Outpatient Medications (Endocrine & Metabolic):  .  alendronate (FOSAMAX) 70 MG tablet, Take 70 mg by mouth every 7 (seven) days. Take with a full glass of water on an empty stomach.  Current Outpatient Medications (Cardiovascular):  .  rosuvastatin (CRESTOR) 5 MG tablet, Take 5 mg by mouth every morning.  Current Outpatient Medications (Respiratory):  Marland Kitchen  Fexofenadine HCl (MUCINEX ALLERGY PO), Take 800 mg by mouth every morning.  Current Outpatient Medications (Analgesics):  .  aspirin 81 MG tablet, Take 81 mg by mouth every morning.    Current Outpatient Medications (Other):  Marland Kitchen  ALPRAZolam (XANAX) 0.5 MG tablet, Take 0.5 mg by mouth daily as needed for sleep.  .   Calcium Carbonate (CALCIUM 600 PO), Take 1 tablet by mouth every morning.  .  Cholecalciferol (VITAMIN D PO), Take 1 tablet by mouth every morning.  .  Coenzyme Q10 (CO Q 10 PO), Take 1 tablet by mouth every morning.  .  Iodine, Kelp, (KELP PO)*, Take 1 tablet by mouth every morning.  .  Multiple Vitamins-Minerals (MULTIVITAMIN PO), Take 1 tablet by mouth every morning.  Marland Kitchen  OVER THE COUNTER MEDICATION, Take 2 capsules by mouth every morning. (Astraygalus) .  Probiotic Product (PROBIOTIC PO), Take 1 tablet by mouth every morning.  .  TURMERIC PO, Take 1 tablet by mouth every morning.  * These medications belong to multiple therapeutic classes and are listed under each applicable group.    Past medical history, social, surgical and family history all reviewed in electronic medical record.  No pertanent information unless stated regarding to the chief complaint.   Review of Systems:  No headache, visual changes, nausea, vomiting, diarrhea, constipation, dizziness, abdominal pain, skin rash, fevers, chills, night sweats, weight loss, swollen lymph nodes, body aches, joint swelling, muscle aches, chest pain, shortness of breath, mood changes.   Objective  Blood pressure 122/64, pulse 87, height 5\' 4"  (1.626 m), last menstrual period 03/08/1992, SpO2 99 %.    General: No apparent distress alert and oriented x3 mood and affect normal, dressed appropriately.  HEENT: Pupils equal, extraocular movements intact  Respiratory: Patient's speak in full sentences and does not appear short of breath  Cardiovascular: No lower extremity edema, non tender, no erythema  Skin: Warm dry intact with no signs of infection or rash on extremities or on axial skeleton.  Abdomen: Soft nontender  Neuro: Cranial nerves II through XII are intact, neurovascularly intact in all extremities with 2+ DTRs and 2+ pulses.  Lymph: No lymphadenopathy of posterior or anterior cervical chain or axillae bilaterally.  Gait normal  with good balance and coordination.  MSK:  tender with limited range of motion and good stability and symmetric strength and tone of  elbows, wrist, hip, knee and ankles bilaterally.  Significant arthritic changes of multiple joints Left shoulder exam shows some mild atrophy of the shoulder girdle is very close to the contralateral side.  Mild positive impingement signs but 5 out of 5 strength of the rotator cuff.  Mild positive crossover as well as O'Brien's.  Tenderness over the posterior aspect of the shoulder.  MSK US performed of: Left shoulder This study was ordered, performed, and interpreted by Charlann Boxer D.O.  Shoulder:   Supraspinatus:  Appears normal on long and transverse views, mild degenerative changes but no true tear Subscapularis:  Appears normal on long and transverse views. AC joint: Moderate arthritis but no synovitis Glenohumeral Joint: Moderate arthritis Glenoid Labrum: Patient does have a fairly large labral tear noted posteriorly. Biceps Tendon: Hypoechoic changes within the tendon sheath Impression: Likely large SLAP tear  97110; 15 additional minutes spent for Therapeutic exercises as stated in above notes.  This included exercises focusing on stretching, strengthening, with significant focus on eccentric aspects.   Long term goals include an improvement in range of motion, strength, endurance as well as avoiding reinjury. Patient's frequency would include in 1-2 times a day, 3-5 times a week for a duration of 6-12 weeks.  Shoulder Exercises that included:  Basic scapular stabilization to include adduction and depression of scapula Scaption, focusing on proper movement and good control Internal and External rotation utilizing a theraband, with elbow tucked at side entire time Rows with theraband which was given  Proper technique shown and discussed handout in great detail with ATC.  All questions were discussed and answered.     Impression and Recommendations:      This case required medical decision making of moderate complexity. The above documentation has been reviewed and is accurate and complete Lyndal Pulley, DO       Note: This dictation was prepared with Dragon dictation along with smaller phrase technology. Any transcriptional errors that result from this process are unintentional.

## 2018-07-20 NOTE — Patient Instructions (Signed)
Good to see you  Keep hands within peripheral vision.  Ice 20 minutes 2 times daily. Usually after activity and before bed. Exercises 3 times a week.  No lifting more then 20 pounds ever with that arm Listen to your body If it worsens we can do injection  See me in 6 weeks if not doing well

## 2018-07-20 NOTE — Assessment & Plan Note (Signed)
Patient does have more of a labral tear.  I discussed with patient in great length.  Patient though would like to try to do this conservatively.  Patient given home exercises and work with Product/process development scientist.  Discussed topical anti-inflammatories and icing regimen.  I believe patient will do well in the next day would be potential injections and formal physical therapy.  Follow-up again in 6 weeks

## 2018-08-31 ENCOUNTER — Ambulatory Visit: Payer: Medicare Other | Admitting: Family Medicine

## 2018-08-31 ENCOUNTER — Encounter: Payer: Self-pay | Admitting: Family Medicine

## 2018-08-31 ENCOUNTER — Other Ambulatory Visit: Payer: Self-pay

## 2018-08-31 ENCOUNTER — Ambulatory Visit: Payer: Self-pay

## 2018-08-31 VITALS — BP 130/74 | HR 69 | Ht 64.0 in | Wt 131.0 lb

## 2018-08-31 DIAGNOSIS — M7602 Gluteal tendinitis, left hip: Secondary | ICD-10-CM | POA: Diagnosis not present

## 2018-08-31 DIAGNOSIS — M24112 Other articular cartilage disorders, left shoulder: Secondary | ICD-10-CM | POA: Diagnosis not present

## 2018-08-31 DIAGNOSIS — M25552 Pain in left hip: Secondary | ICD-10-CM | POA: Diagnosis not present

## 2018-08-31 NOTE — Assessment & Plan Note (Signed)
Doing quite well with conservative therapy.  No significant change in management at this time.  As long as patient is well continue with conservative therapy.  Discussed certain range of motion such as reaching across the body or the possibility of severe external range of motion will cause more discomfort.

## 2018-08-31 NOTE — Patient Instructions (Signed)
Good to see you  Tried gluteal tendon injection New exercises today  See me again in 6 weeks if not better

## 2018-08-31 NOTE — Assessment & Plan Note (Signed)
Patient does have pain that seems to be more secondary to the sacroiliac joint but now seems to be more constant around the gluteal area.  Discussed with patient about home exercises, icing regimen, which activities to do which wants to avoid.  Patient is to increase activity after the injection slowly.  Discussed icing regimen and home exercises.  Discussed posture and ergonomics.  Follow-up again in 4 to 6 weeks.

## 2018-08-31 NOTE — Progress Notes (Signed)
[name] [place], [place] [address] Phone: [phone] Subjective:   I Brooke [name] am serving as a Education administrator for Dr. Hulan Saas.  I'm seeing this patient by the request  of:    CC:   [id number]   07/20/2018 Patient does have more of a labral tear.  I discussed with patient in great length.  Patient though would like to try to do this conservatively.  Patient given home exercises and work with Product/process development scientist.  Discussed topical anti-inflammatories and icing regimen.  I believe patient will do well in the next day would be potential injections and formal physical therapy.  Follow-up again in 6 weeks  08/31/2018 Brooke [name] is a 78 y.o. female coming in with complaint of left shoulder and left hip pain. Hip is doing worse and her shoulder is better.   Patient states that her shoulder pain seems to be doing approximately 90% better.  Only certain range of motion gives her some difficulty.  Rates the severity of pain is 8 out of 10 when this occurs.  States that only last seconds and then seems to go away. Patient does have what appears to be more painful when walking.  Seems to get better with rest.  Patient's insulin seems to be okay.  Points near the lateral gluteal area.  Denies any radiation down the leg.  Feels tighter than the other side.    Past Medical History:  Diagnosis Date  . Anxiety   . Depression    no history of.  . Fibroid   . HSV-2 (herpes simplex virus 2) infection   . Multiple thyroid nodules    followed by pcp  . Osteopenia   . Rheumatic fever    no cardiology issues   Past Surgical History:  Procedure Laterality Date  . CATARACT EXTRACTION Right   . COLONOSCOPY WITH PROPOFOL N/A 01/08/2014   Procedure: COLONOSCOPY WITH PROPOFOL;  Surgeon: Garlan Fair, MD;  Location: [facility];  Service: Endoscopy;  Laterality: N/A;  . PELVIC LAPAROSCOPY     [name]  . TONSILLECTOMY    . WRIST FRACTURE SURGERY  Left    '11-hardware retained   Social History   Socioeconomic History  . Marital status: Divorced    Spouse name: Not on file  . Number of children: Not on file  . Years of education: Not on file  . Highest education level: Not on file  Occupational History  . Not on file  Social Needs  . Financial resource strain: Not on file  . Food insecurity    Worry: Not on file    Inability: Not on file  . Transportation needs    Medical: Not on file    Non-medical: Not on file  Tobacco Use  . Smoking status: Never Smoker  Substance and Sexual Activity  . Alcohol use: Yes    Alcohol/week: 5.0 standard drinks    Types: 5 drink(s) per week    Comment: 2 wine glasses per day.  . Drug use: No  . Sexual activity: Yes    Partners: Male    Comment: husband vasectomy  Lifestyle  . Physical activity    Days per week: Not on file    Minutes per session: Not on file  . Stress: Not on file  Relationships  . Social Herbalist on phone: Not on file    Gets together: Not on file    Attends religious  service: Not on file    Active member of club or organization: Not on file    Attends meetings of clubs or organizations: Not on file    Relationship status: Not on file  Other Topics Concern  . Not on file  Social History Narrative  . Not on file   Allergies  Allergen Reactions  . Latex     Long period ... Red skin    Family History  Problem Relation Age of Onset  . Cancer Sister        ductal invasive cancer  . Breast cancer Sister 36  . Cancer Maternal Grandmother        colon  . Cancer Father        lung (smoker)  . Depression Sister   . Osteoporosis Sister     Current Outpatient Medications (Endocrine & Metabolic):  .  alendronate (FOSAMAX) 70 MG tablet, Take 70 mg by mouth every 7 (seven) days. Take with a full glass of water on an empty stomach.  Current Outpatient Medications (Cardiovascular):  .  rosuvastatin (CRESTOR) 5 MG tablet, Take 5 mg by mouth every  morning.  Current Outpatient Medications (Respiratory):  Marland Kitchen  Fexofenadine HCl (MUCINEX ALLERGY PO), Take 800 mg by mouth every morning.  Current Outpatient Medications (Analgesics):  .  aspirin 81 MG tablet, Take 81 mg by mouth every morning.    Current Outpatient Medications (Other):  Marland Kitchen  ALPRAZolam (XANAX) 0.5 MG tablet, Take 0.5 mg by mouth daily as needed for sleep.  .  Calcium Carbonate (CALCIUM 600 PO), Take 1 tablet by mouth every morning.  .  Cholecalciferol (VITAMIN D PO), Take 1 tablet by mouth every morning.  .  Coenzyme Q10 (CO Q 10 PO), Take 1 tablet by mouth every morning.  .  Iodine, Kelp, (KELP PO)*, Take 1 tablet by mouth every morning.  .  Multiple Vitamins-Minerals (MULTIVITAMIN PO), Take 1 tablet by mouth every morning.  Marland Kitchen  OVER THE COUNTER MEDICATION, Take 2 capsules by mouth every morning. (Astraygalus) .  Probiotic Product (PROBIOTIC PO), Take 1 tablet by mouth every morning.  .  TURMERIC PO, Take 1 tablet by mouth every morning.  * These medications belong to multiple therapeutic classes and are listed under each applicable group.    Past medical history, social, surgical and family history all reviewed in electronic medical record.  No pertanent information unless stated regarding to the chief complaint.   Review of Systems:  No headache, visual changes, nausea, vomiting, diarrhea, constipation, dizziness, abdominal pain, skin rash, fevers, chills, night sweats, weight loss, swollen lymph nodes, body aches, joint swelling,  chest pain, shortness of breath, mood changes.  Positive muscle aches  Objective  Blood pressure 130/74, pulse 69, height 5\' 4"  (1.626 m), weight 131 lb (59.4 kg), last menstrual period 03/08/1992, SpO2 98 %.     General: No apparent distress alert and oriented x3 mood and affect normal, dressed appropriately.  HEENT: Pupils equal, extraocular movements intact  Respiratory: Patient's speak in full sentences and does not appear short of  breath  Cardiovascular: No lower extremity edema, non tender, no erythema  Skin: Warm dry intact with no signs of infection or rash on extremities or on axial skeleton.  Abdomen: Soft nontender  Neuro: Cranial nerves II through XII are intact, neurovascularly intact in all extremities with 2+ DTRs and 2+ pulses.  Lymph: No lymphadenopathy of posterior or anterior cervical chain or axillae bilaterally.  Gait normal with good balance and coordination.  MSK:  Non tender with full range of motion and good stability and symmetric strength and tone of shoulders, elbows, wrist, , knee and ankles bilaterally.  Right shoulder No swelling, ecchymoses.  No gross deformity. No TTP. FROM.  Mild and still remain Negative Speeds, Yergasons. Strength 5/5 with empty can and resisted internal/external rotation. Negative apprehension. NV intact distally.  Left hip exam shows some significant tenderness of the left gluteal area.  Seems to be more at the musculotendinous junction.  Minimal pain over the piriformis.  Pain with resisted abduction of the hip.  No pain with hip flexion.  Full internal range of motion.  Procedure: Real-time Ultrasound Guided Injection of left gluteal tendon sheath Device: GE Logiq Q7 Ultrasound guided injection is preferred based studies that show increased duration, increased effect, greater accuracy, decreased procedural pain, increased response rate, and decreased cost with ultrasound guided versus blind injection.  Verbal informed consent obtained.  Time-out conducted.  Noted no overlying erythema, induration, or other signs of local infection.  Skin prepped in a sterile fashion.  Local anesthesia: Topical Ethyl chloride.  With sterile technique and under real time ultrasound guidance: With a 21-gauge 2 inch needle injected with 0.5 cc of 0.5% Marcaine and 1 cc of Kenalog 40 mg/mL into the musculotendinous junction of the gluteal tendon. Completed without difficulty  Pain  immediately resolved suggesting accurate placement of the medication.  Advised to call if fevers/chills, erythema, induration, drainage, or persistent bleeding.  Images permanently stored and available for review in the ultrasound unit.  Impression: Technically successful ultrasound guided injection.   Impression and Recommendations:     This case required medical decision making of moderate complexity. The above documentation has been reviewed and is accurate and complete Brooke Pulley, DO       Note: This dictation was prepared with Dragon dictation along with smaller phrase technology. Any transcriptional errors that result from this process are unintentional.

## 2019-01-02 ENCOUNTER — Encounter (INDEPENDENT_AMBULATORY_CARE_PROVIDER_SITE_OTHER): Payer: Medicare Other | Admitting: Ophthalmology

## 2019-01-11 ENCOUNTER — Encounter (INDEPENDENT_AMBULATORY_CARE_PROVIDER_SITE_OTHER): Payer: Medicare Other | Admitting: Ophthalmology

## 2019-01-11 ENCOUNTER — Other Ambulatory Visit: Payer: Self-pay

## 2019-01-11 DIAGNOSIS — H43813 Vitreous degeneration, bilateral: Secondary | ICD-10-CM | POA: Diagnosis not present

## 2019-01-11 DIAGNOSIS — H35371 Puckering of macula, right eye: Secondary | ICD-10-CM | POA: Diagnosis not present

## 2019-01-11 DIAGNOSIS — H33303 Unspecified retinal break, bilateral: Secondary | ICD-10-CM | POA: Diagnosis not present

## 2019-02-21 ENCOUNTER — Other Ambulatory Visit: Payer: Medicare Other

## 2019-02-22 ENCOUNTER — Ambulatory Visit: Payer: Medicare Other | Attending: Internal Medicine

## 2019-02-22 DIAGNOSIS — U071 COVID-19: Secondary | ICD-10-CM

## 2019-02-23 LAB — NOVEL CORONAVIRUS, NAA: SARS-CoV-2, NAA: NOT DETECTED

## 2019-03-18 ENCOUNTER — Ambulatory Visit: Payer: Medicare Other | Attending: Internal Medicine

## 2019-03-18 DIAGNOSIS — Z23 Encounter for immunization: Secondary | ICD-10-CM

## 2019-03-18 NOTE — Progress Notes (Signed)
   Covid-19 Vaccination Clinic  Name:  Brooke [name]    MRN: [id number] DOB: 05/13/1940  03/18/2019  [name] was observed post Covid-19 immunization for 15 minutes without incidence. She was provided with Vaccine Information Sheet and instruction to access the V-Safe system.   [name] was instructed to call 911 with any severe reactions post vaccine: Marland Kitchen Difficulty breathing  . Swelling of your face and throat  . A fast heartbeat  . A bad rash all over your body  . Dizziness and weakness    Immunizations Administered    Name Date Dose VIS Date Route   Pfizer COVID-19 Vaccine 03/18/2019 12:43 PM 0.3 mL 02/16/2019 Intramuscular   Manufacturer: [place]   Lot: [id number]   [place]: [id number]

## 2019-04-05 ENCOUNTER — Ambulatory Visit: Payer: Medicare Other

## 2019-04-07 ENCOUNTER — Ambulatory Visit: Payer: Medicare Other | Attending: Internal Medicine

## 2019-04-07 DIAGNOSIS — Z23 Encounter for immunization: Secondary | ICD-10-CM | POA: Insufficient documentation

## 2019-04-07 NOTE — Progress Notes (Signed)
   Covid-19 Vaccination Clinic  Name:  Brooke [name]    MRN: [id number] DOB: 15-Sep-1940  04/07/2019  Brooke [name] was observed post Covid-19 immunization for 15 minutes without incidence. She was provided with Vaccine Information Sheet and instruction to access the V-Safe system.   Brooke [name] was instructed to call 911 with any severe reactions post vaccine: Marland Kitchen Difficulty breathing  . Swelling of your face and throat  . A fast heartbeat  . A bad rash all over your body  . Dizziness and weakness    Immunizations Administered    Name Date Dose VIS Date Route   Pfizer COVID-19 Vaccine 04/07/2019 10:46 AM 0.3 mL 02/16/2019 Intramuscular   Manufacturer: [place]   Lot: [id number]   [place]: [id number]

## 2019-04-11 ENCOUNTER — Ambulatory Visit: Payer: Medicare Other | Admitting: Family Medicine

## 2019-04-11 ENCOUNTER — Other Ambulatory Visit: Payer: Self-pay

## 2019-04-11 ENCOUNTER — Ambulatory Visit (INDEPENDENT_AMBULATORY_CARE_PROVIDER_SITE_OTHER): Payer: Medicare Other

## 2019-04-11 ENCOUNTER — Encounter: Payer: Self-pay | Admitting: Family Medicine

## 2019-04-11 VITALS — BP 124/82 | HR 69 | Ht 64.0 in | Wt 131.0 lb

## 2019-04-11 DIAGNOSIS — M25511 Pain in right shoulder: Secondary | ICD-10-CM | POA: Diagnosis not present

## 2019-04-11 NOTE — Patient Instructions (Signed)
Keep hands in peripheral vision See me in 5-6 weeks

## 2019-04-11 NOTE — Assessment & Plan Note (Signed)
On exam today patient does have right shoulder pain with movement anything on ultrasound that shows a rotator cuff tear.  Patient does have some chronic degenerative changes which could be contributing.  Likely more of a sprain, home exercises given, icing regimen, topical anti-inflammatories, no significant change in medications follow-up again in 4 weeks.  Worsening pain consider injections formal physical therapy

## 2019-04-11 NOTE — Progress Notes (Signed)
[place] Phone: [phone] Subjective:   Brooke [name], am serving as a scribe for Dr. Hulan Saas. This visit occurred during the SARS-CoV-2 public health emergency.  Safety protocols were in place, including screening questions prior to the visit, additional usage of staff PPE, and extensive cleaning of exam room while observing appropriate contact time as indicated for disinfecting solutions.   I'm seeing this patient by the request  of:  Stoneking, Hal, MD  CC: Right shoulder pain  [id number]  Brooke [name] is a 79 y.o. female coming in with complaint of right arm pain. Last seen on 08/31/2018 for left hip pain. Pain for one week. Has been doing exercise video to get ready for bathing suit season. Pain in anterior shoulder has improved with pain at end ranges. Is going to start riding her horse next week and wants to make sure she is safe to do so.  Patient denies any nighttime pain.  States that initially had some decrease in range of motion but has been improving slowly already.      Past Medical History:  Diagnosis Date  . Anxiety   . Depression    [name] history of.  . Fibroid   . HSV-2 (herpes simplex virus 2) infection   . Multiple thyroid nodules    followed by pcp  . Osteopenia   . Rheumatic fever    [name] cardiology issues   Past Surgical History:  Procedure Laterality Date  . CATARACT EXTRACTION Right   . COLONOSCOPY WITH PROPOFOL N/A 01/08/2014   Procedure: COLONOSCOPY WITH PROPOFOL;  Surgeon: Garlan Fair, MD;  Location: [facility];  Service: Endoscopy;  Laterality: N/A;  . PELVIC LAPAROSCOPY     [name]  . TONSILLECTOMY    . WRIST FRACTURE SURGERY Left    '11-hardware retained   Social History   Socioeconomic History  . Marital status: Divorced    Spouse name: Not on file  . Number of children: Not on file  . Years of education: Not on file  . Highest education level:  Not on file  Occupational History  . Not on file  Tobacco Use  . Smoking status: Never Smoker  Substance and Sexual Activity  . Alcohol use: Yes    Alcohol/week: 5.0 standard drinks    Types: 5 drink(s) per week    Comment: 2 wine glasses per day.  . Drug use: [name]  . Sexual activity: Yes    Partners: Male    Comment: husband vasectomy  Other Topics Concern  . Not on file  Social History Narrative  . Not on file   Social Determinants of Health   Financial Resource Strain:   . Difficulty of Paying Living Expenses: Not on file  Food Insecurity:   . Worried About Charity fundraiser in the Last Year: Not on file  . Ran Out of Food in the Last Year: Not on file  Transportation Needs:   . Lack of Transportation (Medical): Not on file  . Lack of Transportation (Non-Medical): Not on file  Physical Activity:   . Days of Exercise per Week: Not on file  . Minutes of Exercise per Session: Not on file  Stress:   . Feeling of Stress : Not on file  Social Connections:   . Frequency of Communication with Friends and Family: Not on file  . Frequency of Social Gatherings with Friends and Family: Not on file  .  Attends Religious Services: Not on file  . Active Member of Clubs or Organizations: Not on file  . Attends Archivist Meetings: Not on file  . Marital Status: Not on file   Allergies  Allergen Reactions  . Latex     Long period ... Red skin    Family History  Problem Relation Age of Onset  . Cancer Sister        ductal invasive cancer  . Breast cancer Sister 28  . Cancer Maternal Grandmother        colon  . Cancer Father        lung (smoker)  . Depression Sister   . Osteoporosis Sister     Current Outpatient Medications (Endocrine & Metabolic):  .  alendronate (FOSAMAX) 70 MG tablet, Take 70 mg by mouth every 7 (seven) days. Take with a full glass of water on an empty stomach.  Current Outpatient Medications (Cardiovascular):  .  rosuvastatin (CRESTOR) 5  MG tablet, Take 5 mg by mouth every morning.  Current Outpatient Medications (Respiratory):  Marland Kitchen  Fexofenadine HCl (MUCINEX ALLERGY PO), Take 800 mg by mouth every morning.  Current Outpatient Medications (Analgesics):  .  aspirin 81 MG tablet, Take 81 mg by mouth every morning.    Current Outpatient Medications (Other):  Marland Kitchen  ALPRAZolam (XANAX) 0.5 MG tablet, Take 0.5 mg by mouth daily as needed for sleep.  .  Calcium Carbonate (CALCIUM 600 PO), Take 1 tablet by mouth every morning.  .  Cholecalciferol (VITAMIN D PO), Take 1 tablet by mouth every morning.  .  Coenzyme Q10 (CO Q 10 PO), Take 1 tablet by mouth every morning.  .  Iodine, Kelp, (KELP PO)*, Take 1 tablet by mouth every morning.  .  Multiple Vitamins-Minerals (MULTIVITAMIN PO), Take 1 tablet by mouth every morning.  Marland Kitchen  OVER THE COUNTER MEDICATION, Take 2 capsules by mouth every morning. (Astraygalus) .  Probiotic Product (PROBIOTIC PO), Take 1 tablet by mouth every morning.  .  TURMERIC PO, Take 1 tablet by mouth every morning.  * These medications belong to multiple therapeutic classes and are listed under each applicable group.   Reviewed prior external information including notes and imaging from  primary care provider As well as notes that were available from care everywhere and other healthcare systems.  Past medical history, social, surgical and family history all reviewed in electronic medical record.  [name] pertanent information unless stated regarding to the chief complaint.   Review of Systems:  [name] headache, visual changes, nausea, vomiting, diarrhea, constipation, dizziness, abdominal pain, skin rash, fevers, chills, night sweats, weight loss, swollen lymph nodes, body aches, joint swelling, chest pain, shortness of breath, mood changes. POSITIVE muscle aches  Objective  Blood pressure 124/82, pulse 69, height 5\' 4"  (1.626 m), weight 131 lb (59.4 kg), last menstrual period 03/08/1992, SpO2 98 %.   General: [name]  apparent distress alert and oriented x3 mood and affect normal, dressed appropriately.  HEENT: Pupils equal, extraocular movements intact  Respiratory: Patient's speak in full sentences and does not appear short of breath  Cardiovascular: [name] lower extremity edema, non tender, [name] erythema  Skin: Warm dry intact with [name] signs of infection or rash on extremities or on axial skeleton.  Abdomen: Soft nontender  Neuro: Cranial nerves II through XII are intact, neurovascularly intact in all extremities with 2+ DTRs and 2+ pulses.  Lymph: [name] lymphadenopathy of posterior or anterior cervical chain or axillae bilaterally.  Gait normal with good  balance and coordination.  MSK: Mild arthritic changes of multiple joints Right shoulder exam does have unfortunately some mild tenderness to palpation diffusely.  Patient does have active range of motion fully but does have pain greater than 90 degrees of forward flexion.  Patient does have positive impingement noted.  Rotator cuff strength is intact.  Does have some apprehension positive test as well.  Neurovascularly intact distally.  Limited musculoskeletal ultrasound was performed and interpreted by Lyndal Pulley  Limited ultrasound of patient's right shoulder shows that patient does have maybe some mild chronic tendinopathy of the supraspinatus but [name] true tear appreciated.  Moderate degenerative changes of the glenohumeral and the acromioclavicular joint.  Otherwise fairly unremarkable. Impression: Mild to moderate arthritic changes but [name] acute rotator cuff tear  [id number]; 15 additional minutes spent for Therapeutic exercises as stated in above notes.  This included exercises focusing on stretching, strengthening, with significant focus on eccentric aspects.   Long term goals include an improvement in range of motion, strength, endurance as well as avoiding reinjury. Patient's frequency would include in 1-2 times a day, 3-5 times a week for a duration of 6-12  weeks. Shoulder Exercises that included:  Basic scapular stabilization to include adduction and depression of scapula Scaption, focusing on proper movement and good control Internal and External rotation utilizing a theraband, with elbow tucked at side entire time Rows with theraband   Proper technique shown and discussed handout in great detail with ATC.  All questions were discussed and answered.     Impression and Recommendations:     This case required medical decision making of moderate complexity. The above documentation has been reviewed and is accurate and complete Lyndal Pulley, DO       Note: This dictation was prepared with Dragon dictation along with smaller phrase technology. Any transcriptional errors that result from this process are unintentional.

## 2019-04-17 ENCOUNTER — Ambulatory Visit: Payer: Medicare Other | Admitting: Family Medicine

## 2019-05-09 ENCOUNTER — Other Ambulatory Visit: Payer: Self-pay | Admitting: Geriatric Medicine

## 2019-05-09 DIAGNOSIS — Z1231 Encounter for screening mammogram for malignant neoplasm of breast: Secondary | ICD-10-CM

## 2019-05-23 ENCOUNTER — Ambulatory Visit: Payer: Medicare Other | Admitting: Family Medicine

## 2019-05-29 ENCOUNTER — Encounter: Payer: Self-pay | Admitting: Certified Nurse Midwife

## 2019-06-12 ENCOUNTER — Ambulatory Visit: Payer: Medicare Other

## 2019-06-14 ENCOUNTER — Ambulatory Visit: Payer: Medicare Other

## 2019-06-20 ENCOUNTER — Ambulatory Visit
Admission: RE | Admit: 2019-06-20 | Discharge: 2019-06-20 | Disposition: A | Discharge status: Home / Self Care | Payer: Medicare Other | Source: Ambulatory Visit | Attending: Geriatric Medicine | Admitting: Geriatric Medicine

## 2019-06-20 ENCOUNTER — Other Ambulatory Visit: Payer: Self-pay

## 2019-06-20 DIAGNOSIS — Z1231 Encounter for screening mammogram for malignant neoplasm of breast: Secondary | ICD-10-CM

## 2019-09-11 ENCOUNTER — Ambulatory Visit: Payer: Medicare Other | Admitting: Family Medicine

## 2019-09-11 NOTE — Progress Notes (Deleted)
[place] [address] [place] Phone: [phone] Subjective:    I'm seeing this patient by the request  of:  Lajean Manes, MD  CC:   [id number]  JAKITA DUTKIEWICZ is a 79 y.o. female coming in with complaint of ***  Onset-  Location Duration-  Character- Aggravating factors- Reliving factors-  Therapies tried-  Severity-     Past Medical History:  Diagnosis Date  . Anxiety   . Depression    no history of.  . Fibroid   . HSV-2 (herpes simplex virus 2) infection   . Multiple thyroid nodules    followed by pcp  . Osteopenia   . Rheumatic fever    no cardiology issues   Past Surgical History:  Procedure Laterality Date  . CATARACT EXTRACTION Right   . COLONOSCOPY WITH PROPOFOL N/A 01/08/2014   Procedure: COLONOSCOPY WITH PROPOFOL;  Surgeon: Garlan Fair, MD;  Location: [facility];  Service: Endoscopy;  Laterality: N/A;  . PELVIC LAPAROSCOPY     [name]  . TONSILLECTOMY    . WRIST FRACTURE SURGERY Left    '11-hardware retained   Social History   Socioeconomic History  . Marital status: Divorced    Spouse name: Not on file  . Number of children: Not on file  . Years of education: Not on file  . Highest education level: Not on file  Occupational History  . Not on file  Tobacco Use  . Smoking status: Never Smoker  Substance and Sexual Activity  . Alcohol use: Yes    Alcohol/week: 5.0 standard drinks    Types: 5 drink(s) per week    Comment: 2 wine glasses per day.  . Drug use: No  . Sexual activity: Yes    Partners: Male    Comment: husband vasectomy  Other Topics Concern  . Not on file  Social History Narrative  . Not on file   Social Determinants of Health   Financial Resource Strain:   . Difficulty of Paying Living Expenses:   Food Insecurity:   . Worried About Charity fundraiser in the Last Year:   . Arboriculturist in the Last Year:   Transportation Needs:   . Lexicographer (Medical):   Marland Kitchen Lack of Transportation (Non-Medical):   Physical Activity:   . Days of Exercise per Week:   . Minutes of Exercise per Session:   Stress:   . Feeling of Stress :   Social Connections:   . Frequency of Communication with Friends and Family:   . Frequency of Social Gatherings with Friends and Family:   . Attends Religious Services:   . Active Member of Clubs or Organizations:   . Attends Archivist Meetings:   Marland Kitchen Marital Status:    Allergies  Allergen Reactions  . Latex     Long period ... Red skin    Family History  Problem Relation Age of Onset  . Cancer Sister        ductal invasive cancer  . Breast cancer Sister 5  . Cancer Maternal Grandmother        colon  . Cancer Father        lung (smoker)  . Depression Sister   . Osteoporosis Sister     Current Outpatient Medications (Endocrine & Metabolic):  .  alendronate (FOSAMAX) 70 MG tablet, Take 70 mg by mouth every 7 (seven) days. Take with a full glass  of water on an empty stomach.  Current Outpatient Medications (Cardiovascular):  .  rosuvastatin (CRESTOR) 5 MG tablet, Take 5 mg by mouth every morning.  Current Outpatient Medications (Respiratory):  Marland Kitchen  Fexofenadine HCl (MUCINEX ALLERGY PO), Take 800 mg by mouth every morning.  Current Outpatient Medications (Analgesics):  .  aspirin 81 MG tablet, Take 81 mg by mouth every morning.    Current Outpatient Medications (Other):  Marland Kitchen  ALPRAZolam (XANAX) 0.5 MG tablet, Take 0.5 mg by mouth daily as needed for sleep.  .  Calcium Carbonate (CALCIUM 600 PO), Take 1 tablet by mouth every morning.  .  Cholecalciferol (VITAMIN D PO), Take 1 tablet by mouth every morning.  .  Coenzyme Q10 (CO Q 10 PO), Take 1 tablet by mouth every morning.  .  Iodine, Kelp, (KELP PO)*, Take 1 tablet by mouth every morning.  .  Multiple Vitamins-Minerals (MULTIVITAMIN PO), Take 1 tablet by mouth every morning.  Marland Kitchen  OVER THE COUNTER MEDICATION, Take 2 capsules  by mouth every morning. (Astraygalus) .  Probiotic Product (PROBIOTIC PO), Take 1 tablet by mouth every morning.  .  TURMERIC PO, Take 1 tablet by mouth every morning.  * These medications belong to multiple therapeutic classes and are listed under each applicable group.   Reviewed prior external information including notes and imaging from  primary care provider As well as notes that were available from care everywhere and other healthcare systems.  Past medical history, social, surgical and family history all reviewed in electronic medical record.  No pertanent information unless stated regarding to the chief complaint.   Review of Systems:  No headache, visual changes, nausea, vomiting, diarrhea, constipation, dizziness, abdominal pain, skin rash, fevers, chills, night sweats, weight loss, swollen lymph nodes, body aches, joint swelling, chest pain, shortness of breath, mood changes. POSITIVE muscle aches  Objective  Last menstrual period 03/08/1992.   General: No apparent distress alert and oriented x3 mood and affect normal, dressed appropriately.  HEENT: Pupils equal, extraocular movements intact  Respiratory: Patient's speak in full sentences and does not appear short of breath  Cardiovascular: No lower extremity edema, non tender, no erythema  Neuro: Cranial nerves II through XII are intact, neurovascularly intact in all extremities with 2+ DTRs and 2+ pulses.  Gait normal with good balance and coordination.  MSK:  Non tender with full range of motion and good stability and symmetric strength and tone of shoulders, elbows, wrist, hip, knee and ankles bilaterally.     Impression and Recommendations:     The above documentation has been reviewed and is accurate and complete Lyndal Pulley, DO       Note: This dictation was prepared with Dragon dictation along with smaller phrase technology. Any transcriptional errors that result from this process are unintentional.

## 2019-09-19 ENCOUNTER — Observation Stay (HOSPITAL_COMMUNITY)
Admission: EM | Admit: 2019-09-19 | Discharge: 2019-09-19 | Disposition: A | Discharge status: Home / Self Care | Payer: Medicare Other | Attending: Family Medicine | Admitting: Family Medicine

## 2019-09-19 ENCOUNTER — Observation Stay (HOSPITAL_BASED_OUTPATIENT_CLINIC_OR_DEPARTMENT_OTHER): Payer: Medicare Other

## 2019-09-19 ENCOUNTER — Other Ambulatory Visit: Payer: Self-pay | Admitting: Neurology

## 2019-09-19 ENCOUNTER — Emergency Department (HOSPITAL_COMMUNITY): Payer: Medicare Other

## 2019-09-19 ENCOUNTER — Encounter (HOSPITAL_COMMUNITY): Payer: Self-pay | Admitting: *Deleted

## 2019-09-19 ENCOUNTER — Observation Stay (HOSPITAL_COMMUNITY): Payer: Medicare Other

## 2019-09-19 ENCOUNTER — Other Ambulatory Visit: Payer: Self-pay

## 2019-09-19 DIAGNOSIS — I351 Nonrheumatic aortic (valve) insufficiency: Secondary | ICD-10-CM

## 2019-09-19 DIAGNOSIS — Z7982 Long term (current) use of aspirin: Secondary | ICD-10-CM | POA: Insufficient documentation

## 2019-09-19 DIAGNOSIS — F10929 Alcohol use, unspecified with intoxication, unspecified: Secondary | ICD-10-CM

## 2019-09-19 DIAGNOSIS — R29898 Other symptoms and signs involving the musculoskeletal system: Principal | ICD-10-CM | POA: Insufficient documentation

## 2019-09-19 DIAGNOSIS — I639 Cerebral infarction, unspecified: Secondary | ICD-10-CM | POA: Diagnosis not present

## 2019-09-19 DIAGNOSIS — F10920 Alcohol use, unspecified with intoxication, uncomplicated: Secondary | ICD-10-CM

## 2019-09-19 DIAGNOSIS — Z79899 Other long term (current) drug therapy: Secondary | ICD-10-CM | POA: Diagnosis not present

## 2019-09-19 DIAGNOSIS — M21332 Wrist drop, left wrist: Secondary | ICD-10-CM

## 2019-09-19 DIAGNOSIS — Z20822 Contact with and (suspected) exposure to covid-19: Secondary | ICD-10-CM | POA: Diagnosis not present

## 2019-09-19 DIAGNOSIS — F10129 Alcohol abuse with intoxication, unspecified: Secondary | ICD-10-CM | POA: Diagnosis not present

## 2019-09-19 DIAGNOSIS — Y906 Blood alcohol level of 120-199 mg/100 ml: Secondary | ICD-10-CM | POA: Insufficient documentation

## 2019-09-19 DIAGNOSIS — R260 Ataxic gait: Secondary | ICD-10-CM | POA: Insufficient documentation

## 2019-09-19 DIAGNOSIS — E785 Hyperlipidemia, unspecified: Secondary | ICD-10-CM

## 2019-09-19 DIAGNOSIS — E782 Mixed hyperlipidemia: Secondary | ICD-10-CM

## 2019-09-19 DIAGNOSIS — R270 Ataxia, unspecified: Secondary | ICD-10-CM

## 2019-09-19 HISTORY — DX: Hyperlipidemia, unspecified: E78.5

## 2019-09-19 LAB — URINALYSIS, ROUTINE W REFLEX MICROSCOPIC
Bilirubin Urine: NEGATIVE
Glucose, UA: NEGATIVE mg/dL
Hgb urine dipstick: NEGATIVE
Ketones, ur: NEGATIVE mg/dL
Leukocytes,Ua: NEGATIVE
Nitrite: NEGATIVE
Protein, ur: NEGATIVE mg/dL
Specific Gravity, Urine: 1.013 (ref 1.005–1.030)
pH: 5 (ref 5.0–8.0)

## 2019-09-19 LAB — APTT: aPTT: 28 seconds (ref 24–36)

## 2019-09-19 LAB — DIFFERENTIAL
Abs Immature Granulocytes: 0.01 10*3/uL (ref 0.00–0.07)
Basophils Absolute: 0 10*3/uL (ref 0.0–0.1)
Basophils Relative: 0 %
Eosinophils Absolute: 0.1 10*3/uL (ref 0.0–0.5)
Eosinophils Relative: 2 %
Immature Granulocytes: 0 %
Lymphocytes Relative: 50 %
Lymphs Abs: 2.5 10*3/uL (ref 0.7–4.0)
Monocytes Absolute: 0.3 10*3/uL (ref 0.1–1.0)
Monocytes Relative: 5 %
Neutro Abs: 2.2 10*3/uL (ref 1.7–7.7)
Neutrophils Relative %: 43 %

## 2019-09-19 LAB — COMPREHENSIVE METABOLIC PANEL
ALT: 30 U/L (ref 0–44)
AST: 34 U/L (ref 15–41)
Albumin: 5.5 g/dL — ABNORMAL HIGH (ref 3.5–5.0)
Alkaline Phosphatase: 26 U/L — ABNORMAL LOW (ref 38–126)
Anion gap: 15 (ref 5–15)
BUN: 15 mg/dL (ref 8–23)
CO2: 24 mmol/L (ref 22–32)
Calcium: 10.2 mg/dL (ref 8.9–10.3)
Chloride: 101 mmol/L (ref 98–111)
Creatinine, Ser: 0.67 mg/dL (ref 0.44–1.00)
GFR calc Af Amer: >60 mL/min (ref >60)
GFR calc non Af Amer: >60 mL/min (ref >60)
Glucose, Bld: 104 mg/dL — ABNORMAL HIGH (ref 70–99)
Potassium: 4 mmol/L (ref 3.5–5.1)
Sodium: 140 mmol/L (ref 135–145)
Total Bilirubin: 0.7 mg/dL (ref 0.3–1.2)
Total Protein: 8.1 g/dL (ref 6.5–8.1)

## 2019-09-19 LAB — ECHOCARDIOGRAM COMPLETE
Height: 65 in
Weight: 1952 oz

## 2019-09-19 LAB — CBC
HCT: 45.3 % (ref 36.0–46.0)
Hemoglobin: 14.9 g/dL (ref 12.0–15.0)
MCH: 32 pg (ref 26.0–34.0)
MCHC: 32.9 g/dL (ref 30.0–36.0)
MCV: 97.2 fL (ref 80.0–100.0)
Platelets: 194 10*3/uL (ref 150–400)
RBC: 4.66 MIL/uL (ref 3.87–5.11)
RDW: 12.7 % (ref 11.5–15.5)
WBC: 5 10*3/uL (ref 4.0–10.5)
nRBC: 0 % (ref 0.0–0.2)

## 2019-09-19 LAB — LIPID PANEL
Cholesterol: 262 mg/dL — ABNORMAL HIGH (ref 0–200)
HDL: 95 mg/dL (ref >40)
LDL Cholesterol: 135 mg/dL — ABNORMAL HIGH (ref 0–99)
Total CHOL/HDL Ratio: 2.8 RATIO
Triglycerides: 161 mg/dL — ABNORMAL HIGH (ref <150)
VLDL: 32 mg/dL (ref 0–40)

## 2019-09-19 LAB — SARS CORONAVIRUS 2 BY RT PCR (HOSPITAL ORDER, PERFORMED IN ~~LOC~~ HOSPITAL LAB): SARS Coronavirus 2: NEGATIVE

## 2019-09-19 LAB — HEMOGLOBIN A1C
Hgb A1c MFr Bld: 5.3 % (ref 4.8–5.6)
Mean Plasma Glucose: 105.41 mg/dL

## 2019-09-19 LAB — I-STAT CHEM 8, ED
BUN: 16 mg/dL (ref 8–23)
Calcium, Ion: 1.16 mmol/L (ref 1.15–1.40)
Chloride: 103 mmol/L (ref 98–111)
Creatinine, Ser: 0.9 mg/dL (ref 0.44–1.00)
Glucose, Bld: 101 mg/dL — ABNORMAL HIGH (ref 70–99)
HCT: 40 % (ref 36.0–46.0)
Hemoglobin: 13.6 g/dL (ref 12.0–15.0)
Potassium: 4.2 mmol/L (ref 3.5–5.1)
Sodium: 143 mmol/L (ref 135–145)
TCO2: 24 mmol/L (ref 22–32)

## 2019-09-19 LAB — CBG MONITORING, ED: Glucose-Capillary: 94 mg/dL (ref 70–99)

## 2019-09-19 LAB — PROTIME-INR
INR: 0.9 (ref 0.8–1.2)
Prothrombin Time: 11.5 seconds (ref 11.4–15.2)

## 2019-09-19 LAB — ETHANOL: Alcohol, Ethyl (B): 197 mg/dL — ABNORMAL HIGH (ref <10)

## 2019-09-19 LAB — RAPID URINE DRUG SCREEN, HOSP PERFORMED
Amphetamines: NOT DETECTED
Barbiturates: NOT DETECTED
Benzodiazepines: NOT DETECTED
Cocaine: NOT DETECTED
Opiates: NOT DETECTED
Tetrahydrocannabinol: NOT DETECTED

## 2019-09-19 MED ORDER — ROSUVASTATIN CALCIUM 5 MG PO TABS: 10.0000 mg | ORAL_TABLET | Freq: Every day | ORAL | 4 refills | Status: AC

## 2019-09-19 MED ORDER — SODIUM CHLORIDE 0.9% FLUSH
3.0000 mL | Freq: Once | INTRAVENOUS | Status: AC
  Administered 2019-09-19: 3 mL via INTRAVENOUS

## 2019-09-19 MED ORDER — ACETAMINOPHEN 325 MG PO TABS
650.0000 mg | ORAL_TABLET | Freq: Four times a day (QID) | ORAL | Status: DC | PRN
  Administered 2019-09-19: 650 mg via ORAL
  Filled 2019-09-19: qty 2

## 2019-09-19 MED ORDER — IOHEXOL 350 MG/ML SOLN
100.0000 mL | Freq: Once | INTRAVENOUS | Status: AC | PRN
  Administered 2019-09-19: 100 mL via INTRAVENOUS

## 2019-09-19 NOTE — Progress Notes (Signed)
°  Echocardiogram 2D Echocardiogram has been performed.  Brooke [name] 09/19/2019, 2:01 PM

## 2019-09-19 NOTE — H&P (Signed)
History and Physical    ANNTONETTE [name] TDV:[id number] DOB: 1941/03/08 DOA: 09/19/2019  PCP: Lajean Manes, MD   Patient coming from:    Home  Chief Complaint: Left hand weakness, presents as code stroke  HPI: Brooke [name] is a 79 y.o. female with medical history significant for hyperlipidemia, osteopenia who presents by EMS as a code stroke.  Patient lives alone.  Last time of known normal was around 10 PM.  Patient reports that she was sitting in her chair when she fell asleep and woke up around 2 AM and noticed that her left hand was slightly numb and very weak.  She states she had difficulty using her left hand.  She initially thought that her arm just fell asleep but when it did not improve in a few minutes she decided to call 911.  She states she did not have any drooping face, slurred speech, change in vision, numbness or weakness in her extremities.  She states she had no syncope and did not have any falls at home.  He denies having any chest pain, palpitations, abdominal pain, nausea, vomiting, diarrhea, urinary symptoms, fever, cough, shortness of breath.  She reports that she actually had her routine physical exam yesterday morning with her PCP was told she was in good health. Patient reports she does not use tobacco products or illicit drugs.  She states she have approximately 4-5 alcoholic drinks a week.   ED Course: The emergency room patient continues to have left hand weakness and difficulty flexing her wrist and gripping with her left hand.  She was seen by neurology who felt that patient probably has a right lacunar infarct and recommended further work-up for CVA.  CT angiography of the head and neck showed no large vessel occlusions and no acute stroke.  Alcohol level was elevated at 197 in the emergency room.  Review of Systems:  General: Denies fever, chills, weight loss, night sweats.  Denies dizziness.  Denies change in appetite HENT: Denies head trauma, headache,  denies change in hearing, tinnitus.  Denies nasal congestion or bleeding.  Denies sore throat, sores in mouth.  Denies difficulty swallowing Eyes: Denies blurry vision, pain in eye, drainage.  Denies discoloration of eyes. Neck: Denies pain.  Denies swelling.  Denies pain with movement. Cardiovascular: Denies chest pain, palpitations.  Denies edema.  Denies orthopnea Respiratory: Denies shortness of breath, cough.  Denies wheezing.  Denies sputum production Gastrointestinal: Denies abdominal pain, swelling.  Denies nausea, vomiting, diarrhea.  Denies melena.  Denies hematemesis. Musculoskeletal: Denies limitation of movement.  Denies deformity or swelling.  Denies pain.  Denies arthralgias or myalgias. Genitourinary: Denies pelvic pain.  Denies urinary frequency or hesitancy.  Denies dysuria.  Skin: Denies rash.  Denies petechiae, purpura, ecchymosis. Neurological: Denies headache.  Denies syncope.  Denies seizure activity.  Denies weakness or paresthesia.  Denies slurred speech, drooping face.  Denies visual change. Psychiatric: Denies depression, anxiety.  Denies suicidal thoughts or ideation.  Denies hallucinations.  Past Medical History:  Diagnosis Date  . Anxiety   . Depression    no history of.  . Fibroid   . HSV-2 (herpes simplex virus 2) infection   . Hyperlipemia   . Multiple thyroid nodules    followed by pcp  . Osteopenia   . Rheumatic fever    no cardiology issues    Past Surgical History:  Procedure Laterality Date  . CATARACT EXTRACTION Right   . COLONOSCOPY WITH PROPOFOL N/A 01/08/2014   Procedure: COLONOSCOPY  WITH PROPOFOL;  Surgeon: Garlan Fair, MD;  Location: [facility];  Service: Endoscopy;  Laterality: N/A;  . PELVIC LAPAROSCOPY     [name]  . TONSILLECTOMY    . WRIST FRACTURE SURGERY Left    '11-hardware retained    Social History  reports that she has never smoked. She has never used smokeless tobacco. She reports current alcohol use of about 5.0  standard drinks of alcohol per week. She reports that she does not use drugs.  Allergies  Allergen Reactions  . Latex     Long period ... Red skin     Family History  Problem Relation Age of Onset  . Cancer Sister        ductal invasive cancer  . Breast cancer Sister 2  . Cancer Maternal Grandmother        colon  . Cancer Father        lung (smoker)  . Depression Sister   . Osteoporosis Sister      Prior to Admission medications   Medication Sig Start Date End Date Taking? Authorizing Provider  ALPRAZolam Duanne Moron) 0.5 MG tablet Take 0.5 mg by mouth daily as needed for sleep.  03/17/12  Yes [provider]  calcium-vitamin D (OSCAL WITH D) 500-200 MG-UNIT tablet Take 1 tablet by mouth 2 (two) times daily.   Yes [provider]  Cholecalciferol (VITAMIN D) 50 MCG (2000 UT) tablet Take 2,000 Units by mouth daily.   Yes [provider]  clobetasol cream (TEMOVATE) 8.78 % Apply 1 application topically 2 (two) times daily as needed (for rash).   Yes [provider]  denosumab (PROLIA) 60 MG/ML SOSY injection Inject 60 mg into the skin every 6 (six) months.   Yes [provider]  diclofenac Sodium (VOLTAREN) 1 % GEL Apply 2 g topically 4 (four) times daily as needed (for pain).   Yes [provider]  estradiol (ESTRACE) 0.1 MG/GM vaginal cream Place 1 Applicatorful vaginally 2 (two) times a week.   Yes [provider]  guaiFENesin (MUCINEX) 600 MG 12 hr tablet Take 600 mg by mouth 2 (two) times daily as needed for cough.   Yes [provider]  Kelp 100 MG TABS Take 100 mg by mouth daily.   Yes [provider]  loratadine (CLARITIN) 10 MG tablet Take 10 mg by mouth daily as needed for allergies.   Yes [provider]  melatonin 3 MG TABS tablet Take 3 mg by mouth at bedtime.   Yes [provider]  Misc Natural Products (COSAMIN ASU ADVANCED FORMULA PO) Take 1 tablet by mouth daily.   Yes  [provider]  Misc Natural Products (TART CHERRY ADVANCED PO) Take 1 tablet by mouth daily.   Yes [provider]  naproxen sodium (ALEVE) 220 MG tablet Take 220 mg by mouth 2 (two) times daily as needed (for pain).   Yes [provider]  rosuvastatin (CRESTOR) 5 MG tablet Take 5 mg by mouth daily.    Yes [provider]  Turmeric 500 MG TABS Take 500 mg by mouth daily.   Yes [provider]    Physical Exam: Vitals:   09/19/19 0354 09/19/19 0420 09/19/19 0530  BP:  125/64 131/62  Pulse:  91 96  Resp:  17 (!) 22  Temp:  98 F (36.7 C)   TempSrc:  Oral   SpO2:  98% 97%  Weight: 55.3 kg    Height: 5\' 5"  (1.651 m)  Constitutional: NAD, calm, comfortable Vitals:   09/19/19 0354 09/19/19 0420 09/19/19 0530  BP:  125/64 131/62  Pulse:  91 96  Resp:  17 (!) 22  Temp:  98 F (36.7 C)   TempSrc:  Oral   SpO2:  98% 97%  Weight: 55.3 kg    Height: 5\' 5"  (1.651 m)     General: WDWN, Alert and oriented x3.  Eyes: EOMI, PERRL, lids and conjunctivae normal.  Sclera nonicteric.  No nystagmus HENT:  [place]/AT, external ears normal.  Nares patent without epistasis.  Mucous membranes are moist. Posterior pharynx clear of any exudate or lesions. Normal dentition.  Neck: Soft, normal range of motion, supple, no masses, no thyromegaly.  Trachea midline Respiratory: clear to auscultation bilaterally, no wheezing, no crackles. Normal respiratory effort. No accessory muscle use.  Cardiovascular: Regular rate and rhythm, no murmurs / rubs / gallops. No extremity edema. 2+ pedal pulses. No carotid bruits.  Abdomen: Soft, no tenderness, nondistended, no rebound or guarding.  No masses palpated. No hepatosplenomegaly. Bowel sounds normoactive Musculoskeletal: FROM. no clubbing / cyanosis. No joint deformity upper and lower extremities. no contractures. Normal muscle tone.  Skin: Warm, dry, intact no rashes, lesions, ulcers. No induration Neurologic: CN  2-12 grossly intact.  Normal speech.  Sensation intact, patella DTR +1 bilaterally. Strength 5/5 in both lower extremities.  Right arm and hand strength 5 out of 5.  Left forearm and hand strength 4 out of 5 with weakened flexion of left wrist.  Tongue with normal extension without deviation. Psychiatric: Normal judgment and insight.  Normal mood.    Labs on Admission: I have personally reviewed following labs and imaging studies  CBC: Recent Labs  Lab 09/19/19 0339 09/19/19 0422  WBC  --  5.0  NEUTROABS  --  2.2  HGB 13.6 14.9  HCT 40.0 45.3  MCV  --  97.2  PLT  --  597    Basic Metabolic Panel: Recent Labs  Lab 09/19/19 0339 09/19/19 0422  NA 143 140  K 4.2 4.0  CL 103 101  CO2  --  24  GLUCOSE 101* 104*  BUN 16 15  CREATININE 0.90 0.67  CALCIUM  --  10.2    GFR: Estimated Creatinine Clearance: 52.2 mL/min (by C-G formula based on SCr of 0.67 mg/dL).  Liver Function Tests: Recent Labs  Lab 09/19/19 0422  AST 34  ALT 30  ALKPHOS 26*  BILITOT 0.7  PROT 8.1  ALBUMIN 5.5*    Urine analysis:    Component Value Date/Time   COLORURINE STRAW (A) 09/19/2019 0428   APPEARANCEUR CLEAR 09/19/2019 0428   LABSPEC 1.013 09/19/2019 0428   PHURINE 5.0 09/19/2019 0428   GLUCOSEU NEGATIVE 09/19/2019 0428   HGBUR NEGATIVE 09/19/2019 0428   BILIRUBINUR NEGATIVE 09/19/2019 0428   KETONESUR NEGATIVE 09/19/2019 0428   PROTEINUR NEGATIVE 09/19/2019 0428   NITRITE NEGATIVE 09/19/2019 0428   LEUKOCYTESUR NEGATIVE 09/19/2019 0428    Radiological Exams on Admission: CT Code Stroke CTA Head W/WO contrast  Result Date: 09/19/2019 CLINICAL DATA:  Left-sided weakness and slurred speech EXAM: CT ANGIOGRAPHY HEAD AND NECK TECHNIQUE: Multidetector CT imaging of the head and neck was performed using the standard protocol during bolus administration of intravenous contrast. Multiplanar CT image reconstructions and MIPs were obtained to evaluate the vascular anatomy. Carotid stenosis  measurements (when applicable) are obtained utilizing NASCET criteria, using the distal internal carotid diameter as the denominator. CONTRAST:  148mL OMNIPAQUE IOHEXOL 350 MG/ML SOLN COMPARISON:  Noncontrast  head CT earlier today FINDINGS: CTA NECK FINDINGS Aortic arch: Atheromatous plaque. No dilatation. Three vessel branching. Right carotid system: Limited atheromatous changes. No stenosis, ulceration, or beading. Left carotid system: Limited atheromatous changes. No stenosis, ulceration, or beading. Vertebral arteries: The vertebral arteries are smooth and widely patent. Mild right vertebral artery dominance. No proximal subclavian stenosis. Skeleton: No acute or aggressive finding Other neck: Bilateral cataract resection. Upper chest: Negative Review of the MIP images confirms the above findings CTA HEAD FINDINGS Anterior circulation: Mild atheromatous calcification at the carotid siphons. No stenosis, beading, or aneurysm. Posterior circulation: The vertebral and basilar arteries are smooth and widely patent. No branch occlusion or beading. Negative for aneurysm. Venous sinuses: Patent Anatomic variants: None significant Review of the MIP images confirms the above findings IMPRESSION: 1. Unremarkable CTA of the head and neck for age. 2. Overall mild atherosclerosis. Electronically Signed   By: Monte Fantasia M.D.   On: 09/19/2019 05:56   CT Code Stroke CTA Neck W/WO contrast  Result Date: 09/19/2019 CLINICAL DATA:  Left-sided weakness and slurred speech EXAM: CT ANGIOGRAPHY HEAD AND NECK TECHNIQUE: Multidetector CT imaging of the head and neck was performed using the standard protocol during bolus administration of intravenous contrast. Multiplanar CT image reconstructions and MIPs were obtained to evaluate the vascular anatomy. Carotid stenosis measurements (when applicable) are obtained utilizing NASCET criteria, using the distal internal carotid diameter as the denominator. CONTRAST:  142mL OMNIPAQUE  IOHEXOL 350 MG/ML SOLN COMPARISON:  Noncontrast head CT earlier today FINDINGS: CTA NECK FINDINGS Aortic arch: Atheromatous plaque. No dilatation. Three vessel branching. Right carotid system: Limited atheromatous changes. No stenosis, ulceration, or beading. Left carotid system: Limited atheromatous changes. No stenosis, ulceration, or beading. Vertebral arteries: The vertebral arteries are smooth and widely patent. Mild right vertebral artery dominance. No proximal subclavian stenosis. Skeleton: No acute or aggressive finding Other neck: Bilateral cataract resection. Upper chest: Negative Review of the MIP images confirms the above findings CTA HEAD FINDINGS Anterior circulation: Mild atheromatous calcification at the carotid siphons. No stenosis, beading, or aneurysm. Posterior circulation: The vertebral and basilar arteries are smooth and widely patent. No branch occlusion or beading. Negative for aneurysm. Venous sinuses: Patent Anatomic variants: None significant Review of the MIP images confirms the above findings IMPRESSION: 1. Unremarkable CTA of the head and neck for age. 2. Overall mild atherosclerosis. Electronically Signed   By: Monte Fantasia M.D.   On: 09/19/2019 05:56   CT HEAD CODE STROKE WO CONTRAST  Result Date: 09/19/2019 CLINICAL DATA:  Code stroke. 79 year old female with left side weakness and slurred speech. EXAM: CT HEAD WITHOUT CONTRAST TECHNIQUE: Contiguous axial images were obtained from the base of the skull through the vertex without intravenous contrast. COMPARISON:  Head CT 02/01/2010. FINDINGS: Brain: No midline shift, mass effect, or evidence of intracranial mass lesion. No ventriculomegaly. Mild for age periventricular white matter hypodensity. No acute cortically based infarct or cortical encephalomalacia identified. No acute intracranial hemorrhage identified. Vascular: Mild Calcified atherosclerosis at the skull base. No suspicious intracranial vascular hyperdensity. Skull:  No acute osseous abnormality identified. Sinuses/Orbits: Occasional mild mucosal thickening. Generally well pneumatized sinuses. Tympanic cavities and mastoids are clear. Other: No acute orbit or scalp soft tissue finding. ASPECTS [facility] Stroke Program Early CT Score) Total score (0-10 with 10 being normal): 10 IMPRESSION: 1. No acute cortically based infarct or acute intracranial hemorrhage identified. ASPECTS 10. 2. Mild for age nonspecific cerebral white matter disease. 3. These results were communicated to Dr. Cheral Marker at 3:44  amon 7/14/2021by text page via the [facility] messaging system. Electronically Signed   By: Genevie Ann M.D.   On: 09/19/2019 03:44    EKG: Independently reviewed.  EKG shows normal sinus rhythm with no acute ST elevation or depression.  QTc 452  Assessment/Plan Principal Problem:   Weakness of left upper extremity Ms. Stegmann will be observed on medical telemetry floor. Has been evaluated by neurology, Dr. Cheral Marker, neurology will follow patient. Obtain MRI of brain further evaluate for acute CVA.  Symptoms are consistent with a right thalamic lesion. Obtain echocardiogram to evaluate for PFO, EF and wall motion Consult PT/OT/ST. Get n.p.o. until side dysphagia screening by nursing staff. Patient is on Crestor 5 mg a day.  Will increase to 40 mg a day as patient with stroke should be treated with high-dose statin to help prevent recurrent strokes Patient restarted on aspirin 81 mg p.o. daily    Ataxia Consult PT for further evaluation.  She will also need hand PT to help regain strength and function of left hand    Alcohol intoxication [facility]) Patient with significant alcohol fall when she presented to the emergency room. Monitor CIWA scores and provide Ativan if needed for elevated CIWA score to prevent DT and withdrawal.    Hyperlipidemia Continue statin.  Increase dose to 40 mg as patient should be on high-dose statin with acute neurological event    DVT  prophylaxis: SCDs for DVT prophylaxis.  Anticoagulation is held so as not to convert any ischemic stroke to hemorrhagic stroke. Code Status:   Full code Family Communication:  Diagnosis plan discussed with patient in detail.  Patient verbalized understanding and agrees with plan.  Other recommendations to follow as clinical indicated  Disposition Plan:   Patient is from:  Home  Anticipated DC to:  Home  Anticipated DC date:  Anticipate discharge in next 24-36 hours  Anticipated DC barriers: No barriers to discharge identified at this time  Consults called:  Neurology Admission status:  Observation  Severity of Illness: The appropriate patient status for this patient is OBSERVATION. Observation status is judged to be reasonable and necessary in order to provide the required intensity of service to ensure the patient's safety. The patient's presenting symptoms, physical exam findings, and initial radiographic and laboratory data in the context of their medical condition is felt to place them at decreased risk for further clinical deterioration. Furthermore, it is anticipated that the patient will be medically stable for discharge from the hospital within 2 midnights of admission. The following factors support the patient status of observation.   " The patient's presenting symptoms include weakness of left hand and arm.  Left-sided ataxia. " The physical exam findings include left hand weakness with weakened flexion of wrist and weakened grip strength. " The initial radiographic and laboratory data are CT angiography of head and neck showed no large vessel occlusion.  No acute stroke seen on CT scan.  MRI and echocardiogram pending.      Yevonne Aline Manson Luckadoo MD [facility]  How to contact the [facility] Attending or Consulting provider [place] or covering provider during after hours [place], for this patient?   1. Check the care team in [facility] and look for a) attending/consulting [facility] provider  listed and b) the [facility] team listed 2. Log into [email or web address] and use [place]'s universal password to access. If you do not have the password, please contact the hospital operator. 3. Locate the [facility] provider you are looking for under  [facility] and page to a number that you can be directly reached. 4. If you still have difficulty reaching the provider, please page the [facility] (Director on Call) for the Hospitalists listed on amion for assistance.  09/19/2019, 6:26 AM

## 2019-09-19 NOTE — Progress Notes (Signed)
  Echocardiogram 2D Echocardiogram has been performed.  Brooke [name] 09/19/2019, 1:52 PM

## 2019-09-19 NOTE — Evaluation (Addendum)
Physical Therapy Evaluation Patient Details Name: Brooke [name] MRN: [id number] DOB: [birth year] Today's Date: 09/19/2019   History of Present Illness  79yo female presenting with L UE numbness and weakness, difficulty using LUE. Arrived as code CVA; [place] negative, MRI pending, no tpa given due to being out of window. PMH anxiety, HLD, osteopenia, hx L wrist fracture 2011  Clinical Impression   Patient received in bed, pleasant and willing to participate in therapy today. Able to complete all functional bed mobility and transfers with full independence/no device and tolerated gait approximately 276f with light min guard for safety, all gait mechanics and balance WNL. Upon examination of LUE, light touch is WNL on all sides from upper traps to elbow, then is present but impaired from elbow down to fingertips; PROM of wrist and all fingers appears generally WFL but does seem to have some possible mild stiffness with wrist flexion/extension and with thumb ROM, also reports history of trigger finger but not present today. MMT of biceps/triceps approximately 4/5 but wrist flexion/extension 2/5 and very weak finger ADD/ABD at 2-/5, also significant difficulty with L grip strength. No change in symptoms with cervical or shoulder AROM, and cervical distraction and compression does not change symptoms either. Discussed PROM exercise program for wrist and finger flexion and extension as well as thumb flexion/extension/ABD/ADD, also recommendation for skilled OP PT services moving forward. Left in bed with all needs met, transport tech present and attending. Patient not in need of PT in the acute hospital setting- PT signing off, thank you for the referral.     Follow Up Recommendations Outpatient PT;Other (comment) (outpatient upper extremity PT)    Equipment Recommendations  None recommended by PT    Recommendations for Other Services       Precautions / Restrictions Precautions Precautions: Other  (comment) Precaution Comments: L UE and hand weakness Restrictions Weight Bearing Restrictions: No      Mobility  Bed Mobility Overal bed mobility: Independent                Transfers Overall transfer level: Independent Equipment used: None             General transfer comment: no physical assist given, good safety awareness  Ambulation/Gait Ambulation/Gait assistance: Min guard Gait Distance (Feet): 200 Feet Assistive device: None Gait Pattern/deviations: WFL(Within Functional Limits);Step-through pattern Gait velocity: WNL   General Gait Details: gait pattern generally WNL, reports this is slower than her usual but no changes from baseline in terms of balance/strength/gait pattern  Stairs            Wheelchair Mobility    Modified Rankin (Stroke Patients Only)       Balance Overall balance assessment: Independent                                           Pertinent Vitals/Pain Pain Assessment: Faces Faces Pain Scale: Hurts little more Pain Location: HA Pain Descriptors / Indicators: Aching Pain Intervention(s): Limited activity within patient's tolerance;Premedicated before session;Monitored during session    Home Living Family/patient expects to be discharged to:: Private residence Living Arrangements: Alone Available Help at Discharge: Friend(s);Neighbor;Available PRN/intermittently Type of Home: House Home Access: Stairs to enter Entrance Stairs-Rails: Left Entrance Stairs-Number of Steps: 2 Home Layout: Multi-level Home Equipment: None Additional Comments: very active, walks and rides elliptical regularly    Prior Function Level of Independence: Independent  Hand Dominance        Extremity/Trunk Assessment   Upper Extremity Assessment Upper Extremity Assessment: RUE deficits/detail;LUE deficits/detail RUE Deficits / Details: WNL- did not test biceps/triceps due to IV site location RUE  Sensation: WNL RUE Coordination: WNL LUE Deficits / Details: shoulder flexion 4/5, shoulder abduction 4/5, biceps 4/5, triceps 4/5, wrist flexion and extension 2/5, finger ADD/ABD 2-/5, difficluty with finger opposition; hx of trigger finger 3rd L finger. LTT WNL from shoulder to elbow but impaired elbow to fingertips LUE Sensation: decreased light touch;decreased proprioception LUE Coordination: decreased fine motor;decreased gross motor    Lower Extremity Assessment Lower Extremity Assessment: Overall WFL for tasks assessed    Cervical / Trunk Assessment Cervical / Trunk Assessment: Normal  Communication   Communication: No difficulties  Cognition Arousal/Alertness: Awake/alert Behavior During Therapy: WFL for tasks assessed/performed Overall Cognitive Status: Within Functional Limits for tasks assessed                                        General Comments      Exercises     Assessment/Plan    PT Assessment Patient needs continued PT services  PT Problem List Decreased strength;Decreased range of motion;Impaired sensation       PT Treatment Interventions Gait training;Stair training;Therapeutic exercise;Manual techniques;Patient/family education;Therapeutic activities    PT Goals (Current goals can be found in the Care Plan section)  Acute Rehab PT Goals Patient Stated Goal: get LUE working right again PT Goal Formulation: With patient Time For Goal Achievement: 10/03/19 Potential to Achieve Goals: Good    Frequency Other (Comment) (eval only)   Barriers to discharge        Co-evaluation               AM-PAC PT "6 Clicks" Mobility  Outcome Measure Help needed turning from your back to your side while in a flat bed without using bedrails?: None Help needed moving from lying on your back to sitting on the side of a flat bed without using bedrails?: None Help needed moving to and from a bed to a chair (including a wheelchair)?: None Help  needed standing up from a chair using your arms (e.g., wheelchair or bedside chair)?: None Help needed to walk in hospital room?: None Help needed climbing 3-5 steps with a railing? : None 6 Click Score: 24    End of Session   Activity Tolerance: Patient tolerated treatment well Patient left: in bed;with call bell/phone within reach Nurse Communication: Mobility status;Other (comment) (PT reccs) PT Visit Diagnosis: Muscle weakness (generalized) (M62.81)    Time: 2244-9753 PT Time Calculation (min) (ACUTE ONLY): 28 min   Charges:   PT Evaluation $PT Eval Low Complexity: 1 Low PT Treatments $Gait Training: 8-22 mins       Windell Norfolk, DPT, PN1   Supplemental Physical Therapist [place]    Pager [phone] Acute Rehab Office [phone]

## 2019-09-19 NOTE — ED Notes (Signed)
Dr. Xu at bedside

## 2019-09-19 NOTE — Discharge Summary (Signed)
Physician Discharge Summary  MADELON WELSCH DXI:[id number] DOB: 1940/12/03 DOA: 09/19/2019  PCP: Lajean Manes, MD  Admit date: 09/19/2019 Discharge date: 09/19/2019  Admitted From: Home Disposition: Home Recommendations for Outpatient Follow-up:  1. Follow up with PCP in 1-2 weeks 2. Please obtain BMP/CBC in one week 3. Please follow up on the following pending results: Echocardiogram  Home Health: None Equipment/Devices none  Discharge Condition: Stable  CODE STATUS: Full code Diet recommendation: Cardiac Brief/Interim Summary:Traniya Lenna Sciara Gonsoulin is a 79 y.o. female with medical history significant for hyperlipidemia, osteopenia who presents by EMS as a code stroke.  Patient lives alone.  Last time of known normal was around 10 PM.  Patient reports that she was sitting in her chair when she fell asleep and woke up around 2 AM and noticed that her left hand was slightly numb and very weak.  She states she had difficulty using her left hand.  She initially thought that her arm just fell asleep but when it did not improve in a few minutes she decided to call 911.  She states she did not have any drooping face, slurred speech, change in vision, numbness or weakness in her extremities.  She states she had no syncope and did not have any falls at home.  He denies having any chest pain, palpitations, abdominal pain, nausea, vomiting, diarrhea, urinary symptoms, fever, cough, shortness of breath.  She reports that she actually had her routine physical exam yesterday morning with her PCP was told she was in good health. Patient reports she does not use tobacco products or illicit drugs.  She states she have approximately 4-5 alcoholic drinks a week.   ED Course: The emergency room patient continues to have left hand weakness and difficulty flexing her wrist and gripping with her left hand.  She was seen by neurology who felt that patient probably has a right lacunar infarct and recommended further  work-up for CVA.  CT angiography of the head and neck showed no large vessel occlusions and no acute stroke.  Alcohol level was elevated at 197 in the emergency room   Discharge Diagnoses:  Principal Problem:   Weakness of left upper extremity Active Problems:   Ataxia   Alcohol intoxication ([place])   Hyperlipidemia    #1 left wrist drop-patient was admitted as a code stroke.  Stroke work-up included MRI of the brain no acute intracranial process.  Mild cerebral atrophy and mild chronic microvascular ischemic changes. CT angiogram of the head and neck shows unremarkable mild atherosclerosis. Echo is done and the results are pending at the time of discharge.  Patient is anxious to go home.  She will follow-up with [facility] neurology for an EMG. She will also get ambulatory referral to physical therapy.  #2 alcohol abuse discussed with her to abstain from alcohol use.  #3 hyperlipidemia and LDL 135.  Increase the Crestor to 10 mg daily.  Estimated body mass index is 20.3 kg/m as calculated from the following:   Height as of this encounter: 5\' 5"  (1.651 m).   Weight as of this encounter: 55.3 kg.  Discharge Instructions  Discharge Instructions    Ambulatory referral to Physical Therapy   Complete by: As directed    Diet - low sodium heart healthy   Complete by: As directed    Increase activity slowly   Complete by: As directed      Allergies as of 09/19/2019      Reactions   Latex    Long  period ... Red skin       Medication List    TAKE these medications   ALPRAZolam 0.5 MG tablet Commonly known as: XANAX Take 0.5 mg by mouth daily as needed for sleep.   calcium-vitamin D 500-200 MG-UNIT tablet Commonly known as: OSCAL WITH D Take 1 tablet by mouth 2 (two) times daily.   clobetasol cream 0.05 % Commonly known as: TEMOVATE Apply 1 application topically 2 (two) times daily as needed (for rash).   COSAMIN ASU ADVANCED FORMULA PO Take 1 tablet by mouth daily.    TART CHERRY ADVANCED PO Take 1 tablet by mouth daily.   denosumab 60 MG/ML Sosy injection Commonly known as: PROLIA Inject 60 mg into the skin every 6 (six) months.   estradiol 0.1 MG/GM vaginal cream Commonly known as: ESTRACE Place 1 Applicatorful vaginally 2 (two) times a week.   guaiFENesin 600 MG 12 hr tablet Commonly known as: MUCINEX Take 600 mg by mouth 2 (two) times daily as needed for cough.   Kelp 100 MG Tabs Take 100 mg by mouth daily.   loratadine 10 MG tablet Commonly known as: CLARITIN Take 10 mg by mouth daily as needed for allergies.   melatonin 3 MG Tabs tablet Take 3 mg by mouth at bedtime.   naproxen sodium 220 MG tablet Commonly known as: ALEVE Take 220 mg by mouth 2 (two) times daily as needed (for pain).   rosuvastatin 5 MG tablet Commonly known as: CRESTOR Take 5 mg by mouth daily.   Turmeric 500 MG Tabs Take 500 mg by mouth daily.   Vitamin D 50 MCG (2000 UT) tablet Take 2,000 Units by mouth daily.   Voltaren 1 % Gel Generic drug: diclofenac Sodium Apply 2 g topically 4 (four) times daily as needed (for pain).       Follow-up Information    Lajean Manes, MD Follow up.   Specialty: Internal Medicine Contact information: [address] [place] [address] [phone]              Allergies  Allergen Reactions  . Latex     Long period ... Red skin     Consultations: Neurology  Procedures/Studies: CT Code Stroke CTA Head W/WO contrast  Result Date: 09/19/2019 CLINICAL DATA:  Left-sided weakness and slurred speech EXAM: CT ANGIOGRAPHY HEAD AND NECK TECHNIQUE: Multidetector CT imaging of the head and neck was performed using the standard protocol during bolus administration of intravenous contrast. Multiplanar CT image reconstructions and MIPs were obtained to evaluate the vascular anatomy. Carotid stenosis measurements (when applicable) are obtained utilizing NASCET criteria, using the distal internal  carotid diameter as the denominator. CONTRAST:  126mL OMNIPAQUE IOHEXOL 350 MG/ML SOLN COMPARISON:  Noncontrast head CT earlier today FINDINGS: CTA NECK FINDINGS Aortic arch: Atheromatous plaque. No dilatation. Three vessel branching. Right carotid system: Limited atheromatous changes. No stenosis, ulceration, or beading. Left carotid system: Limited atheromatous changes. No stenosis, ulceration, or beading. Vertebral arteries: The vertebral arteries are smooth and widely patent. Mild right vertebral artery dominance. No proximal subclavian stenosis. Skeleton: No acute or aggressive finding Other neck: Bilateral cataract resection. Upper chest: Negative Review of the MIP images confirms the above findings CTA HEAD FINDINGS Anterior circulation: Mild atheromatous calcification at the carotid siphons. No stenosis, beading, or aneurysm. Posterior circulation: The vertebral and basilar arteries are smooth and widely patent. No branch occlusion or beading. Negative for aneurysm. Venous sinuses: Patent Anatomic variants: None significant Review of the MIP images confirms  the above findings IMPRESSION: 1. Unremarkable CTA of the head and neck for age. 2. Overall mild atherosclerosis. Electronically Signed   By: Monte Fantasia M.D.   On: 09/19/2019 05:56   CT Code Stroke CTA Neck W/WO contrast  Result Date: 09/19/2019 CLINICAL DATA:  Left-sided weakness and slurred speech EXAM: CT ANGIOGRAPHY HEAD AND NECK TECHNIQUE: Multidetector CT imaging of the head and neck was performed using the standard protocol during bolus administration of intravenous contrast. Multiplanar CT image reconstructions and MIPs were obtained to evaluate the vascular anatomy. Carotid stenosis measurements (when applicable) are obtained utilizing NASCET criteria, using the distal internal carotid diameter as the denominator. CONTRAST:  132mL OMNIPAQUE IOHEXOL 350 MG/ML SOLN COMPARISON:  Noncontrast head CT earlier today FINDINGS: CTA NECK FINDINGS  Aortic arch: Atheromatous plaque. No dilatation. Three vessel branching. Right carotid system: Limited atheromatous changes. No stenosis, ulceration, or beading. Left carotid system: Limited atheromatous changes. No stenosis, ulceration, or beading. Vertebral arteries: The vertebral arteries are smooth and widely patent. Mild right vertebral artery dominance. No proximal subclavian stenosis. Skeleton: No acute or aggressive finding Other neck: Bilateral cataract resection. Upper chest: Negative Review of the MIP images confirms the above findings CTA HEAD FINDINGS Anterior circulation: Mild atheromatous calcification at the carotid siphons. No stenosis, beading, or aneurysm. Posterior circulation: The vertebral and basilar arteries are smooth and widely patent. No branch occlusion or beading. Negative for aneurysm. Venous sinuses: Patent Anatomic variants: None significant Review of the MIP images confirms the above findings IMPRESSION: 1. Unremarkable CTA of the head and neck for age. 2. Overall mild atherosclerosis. Electronically Signed   By: Monte Fantasia M.D.   On: 09/19/2019 05:56   MR BRAIN WO CONTRAST  Result Date: 09/19/2019 CLINICAL DATA:  Ataxia, stroke suspected. EXAM: MRI HEAD WITHOUT CONTRAST TECHNIQUE: Multiplanar, multiecho pulse sequences of the brain and surrounding structures were obtained without intravenous contrast. COMPARISON:  Prior same day CT head and CTA head and neck. FINDINGS: Brain: Scattered and confluent periventricular and subcortical white matter T2/FLAIR hyperintense foci. Mild diffuse parenchymal volume loss with ex vacuo dilatation. No acute infarct or intracranial hemorrhage. No midline shift or extra-axial fluid collection. No mass lesion. Vascular: Please see prior same day CTA head for better evaluation of the intracranial vessels. Skull and upper cervical spine: Normal marrow signal. Sinuses/Orbits: Normal orbits. Sequela of bilateral lens replacement. Mild ethmoid and  left maxillary sinus mucosal thickening. No mastoid effusion. Other: None. IMPRESSION: No acute intracranial process. Mild cerebral atrophy.  Mild chronic microvascular ischemic changes. Electronically Signed   By: Primitivo Gauze M.D.   On: 09/19/2019 12:59   CT HEAD CODE STROKE WO CONTRAST  Result Date: 09/19/2019 CLINICAL DATA:  Code stroke. 79 year old female with left side weakness and slurred speech. EXAM: CT HEAD WITHOUT CONTRAST TECHNIQUE: Contiguous axial images were obtained from the base of the skull through the vertex without intravenous contrast. COMPARISON:  Head CT 02/01/2010. FINDINGS: Brain: No midline shift, mass effect, or evidence of intracranial mass lesion. No ventriculomegaly. Mild for age periventricular white matter hypodensity. No acute cortically based infarct or cortical encephalomalacia identified. No acute intracranial hemorrhage identified. Vascular: Mild Calcified atherosclerosis at the skull base. No suspicious intracranial vascular hyperdensity. Skull: No acute osseous abnormality identified. Sinuses/Orbits: Occasional mild mucosal thickening. Generally well pneumatized sinuses. Tympanic cavities and mastoids are clear. Other: No acute orbit or scalp soft tissue finding. ASPECTS [facility] Stroke Program Early CT Score) Total score (0-10 with 10 being normal): 10 IMPRESSION: 1. No acute cortically  based infarct or acute intracranial hemorrhage identified. ASPECTS 10. 2. Mild for age nonspecific cerebral white matter disease. 3. These results were communicated to Dr. Cheral Marker at 3:44 amon 7/14/2021by text page via the [facility] messaging system. Electronically Signed   By: Genevie Ann M.D.   On: 09/19/2019 03:44    (Echo, Carotid, EGD, Colonoscopy, ERCP)    Subjective:  Patient resting in bed anxious to go home no new complaints other than not able to use her left arm. Discharge Exam: Vitals:   09/19/19 0420 09/19/19 0530  BP: 125/64 131/62  Pulse: 91 96  Resp: 17 (!) 22   Temp: 98 F (36.7 C)   SpO2: 98% 97%   Vitals:   09/19/19 0354 09/19/19 0420 09/19/19 0530  BP:  125/64 131/62  Pulse:  91 96  Resp:  17 (!) 22  Temp:  98 F (36.7 C)   TempSrc:  Oral   SpO2:  98% 97%  Weight: 55.3 kg    Height: 5\' 5"  (1.651 m)      General: Pt is alert, awake, not in acute distress Cardiovascular: RRR, S1/S2 +, no rubs, no gallops Respiratory: CTA bilaterally, no wheezing, no rhonchi Abdominal: Soft, NT, ND, bowel sounds + Extremities left arm decreased sensation, 2 x 5 power    The results of significant diagnostics from this hospitalization (including imaging, microbiology, ancillary and laboratory) are listed below for reference.     Microbiology: Recent Results (from the past 240 hour(s))  SARS Coronavirus 2 by RT PCR (hospital order, performed in [facility] hospital lab) Nasopharyngeal Nasopharyngeal Swab     Status: None   Collection Time: 09/19/19  4:28 AM   Specimen: Nasopharyngeal Swab  Result Value Ref Range Status   SARS Coronavirus 2 NEGATIVE NEGATIVE Final    Comment: (NOTE) SARS-CoV-2 target nucleic acids are NOT DETECTED.  The SARS-CoV-2 RNA is generally detectable in upper and lower respiratory specimens during the acute phase of infection. The lowest concentration of SARS-CoV-2 viral copies this assay can detect is 250 copies / mL. A negative result does not preclude SARS-CoV-2 infection and should not be used as the sole basis for treatment or other patient management decisions.  A negative result may occur with improper specimen collection / handling, submission of specimen other than nasopharyngeal swab, presence of viral mutation(s) within the areas targeted by this assay, and inadequate number of viral copies (<250 copies / mL). A negative result must be combined with clinical observations, patient history, and epidemiological information.  Fact Sheet for Patients:   StrictlyIdeas.no  Fact Sheet  for Healthcare Providers: BankingDealers.co.za  This test is not yet approved or  cleared by the [place] FDA and has been authorized for detection and/or diagnosis of SARS-CoV-2 by FDA under an Emergency Use Authorization (EUA).  This EUA will remain in effect (meaning this test can be used) for the duration of the COVID-19 declaration under Section 564(b)(1) of the Act, 21 U.S.C. section 360bbb-3(b)(1), unless the authorization is terminated or revoked sooner.  Performed at [facility], [place] [address], [place], [place] [address]      Labs: BNP (last 3 results) No results for input(s): BNP in the last 8760 hours. Basic Metabolic Panel: Recent Labs  Lab 09/19/19 0339 09/19/19 0422  NA 143 140  K 4.2 4.0  CL 103 101  CO2  --  24  GLUCOSE 101* 104*  BUN 16 15  CREATININE 0.90 0.67  CALCIUM  --  10.2  Liver Function Tests: Recent Labs  Lab 09/19/19 0422  AST 34  ALT 30  ALKPHOS 26*  BILITOT 0.7  PROT 8.1  ALBUMIN 5.5*   No results for input(s): LIPASE, AMYLASE in the last 168 hours. No results for input(s): AMMONIA in the last 168 hours. CBC: Recent Labs  Lab 09/19/19 0339 09/19/19 0422  WBC  --  5.0  NEUTROABS  --  2.2  HGB 13.6 14.9  HCT 40.0 45.3  MCV  --  97.2  PLT  --  194   Cardiac Enzymes: No results for input(s): CKTOTAL, CKMB, CKMBINDEX, TROPONINI in the last 168 hours. BNP: Invalid input(s): POCBNP CBG: Recent Labs  Lab 09/19/19 0329  GLUCAP 94   D-Dimer No results for input(s): DDIMER in the last 72 hours. Hgb A1c Recent Labs    09/19/19 0422  HGBA1C 5.3   Lipid Profile Recent Labs    09/19/19 0422  CHOL 262*  HDL 95  LDLCALC 135*  TRIG 161*  CHOLHDL 2.8   Thyroid function studies No results for input(s): TSH, T4TOTAL, T3FREE, THYROIDAB in the last 72 hours.  Invalid input(s): FREET3 Anemia work up No results for input(s): VITAMINB12, FOLATE, FERRITIN, TIBC, IRON, RETICCTPCT in the  last 72 hours. Urinalysis    Component Value Date/Time   COLORURINE STRAW (A) 09/19/2019 0428   APPEARANCEUR CLEAR 09/19/2019 0428   LABSPEC 1.013 09/19/2019 0428   PHURINE 5.0 09/19/2019 0428   GLUCOSEU NEGATIVE 09/19/2019 0428   HGBUR NEGATIVE 09/19/2019 0428   BILIRUBINUR NEGATIVE 09/19/2019 0428   KETONESUR NEGATIVE 09/19/2019 0428   PROTEINUR NEGATIVE 09/19/2019 0428   NITRITE NEGATIVE 09/19/2019 0428   LEUKOCYTESUR NEGATIVE 09/19/2019 0428   Sepsis Labs Invalid input(s): PROCALCITONIN,  WBC,  LACTICIDVEN Microbiology Recent Results (from the past 240 hour(s))  SARS Coronavirus 2 by RT PCR (hospital order, performed in [place] hospital lab) Nasopharyngeal Nasopharyngeal Swab     Status: None   Collection Time: 09/19/19  4:28 AM   Specimen: Nasopharyngeal Swab  Result Value Ref Range Status   SARS Coronavirus 2 NEGATIVE NEGATIVE Final    Comment: (NOTE) SARS-CoV-2 target nucleic acids are NOT DETECTED.  The SARS-CoV-2 RNA is generally detectable in upper and lower respiratory specimens during the acute phase of infection. The lowest concentration of SARS-CoV-2 viral copies this assay can detect is 250 copies / mL. A negative result does not preclude SARS-CoV-2 infection and should not be used as the sole basis for treatment or other patient management decisions.  A negative result may occur with improper specimen collection / handling, submission of specimen other than nasopharyngeal swab, presence of viral mutation(s) within the areas targeted by this assay, and inadequate number of viral copies (<250 copies / mL). A negative result must be combined with clinical observations, patient history, and epidemiological information.  Fact Sheet for Patients:   StrictlyIdeas.no  Fact Sheet for Healthcare Providers: BankingDealers.co.za  This test is not yet approved or  cleared by the [place] FDA and has been  authorized for detection and/or diagnosis of SARS-CoV-2 by FDA under an Emergency Use Authorization (EUA).  This EUA will remain in effect (meaning this test can be used) for the duration of the COVID-19 declaration under Section 564(b)(1) of the Act, 21 U.S.C. section 360bbb-3(b)(1), unless the authorization is terminated or revoked sooner.  Performed at [facility], [place] [address], [place], [place] [address]      Time coordinating discharge:  39 minutes  SIGNED:   Benjamine Mola  Orlie Pollen, MD  [facility] 09/19/2019, 2:15 PM Pager   If 7PM-7AM, please contact night-coverage [email or web address] Password [email or web address]

## 2019-09-19 NOTE — ED Provider Notes (Addendum)
TIME SEEN: 3:29 AM  CHIEF COMPLAINT: Code stroke  HPI: Patient is a 79 year old female with history of anxiety who presents to the emergency department with EMS as a code stroke.  Reports last felt normal around 10 PM last night.  Woke up at 2 AM with left arm weakness and numbness.  Feels like she cannot use her left hand.  EMS noted slurred speech.  Blood pressure and blood sugar with EMS within normal limits.  She denies chest pain, shortness of breath.  No headache, head injury 5 mg.  No previous history of stroke.  No history of hypertension, diabetes, hypercholesterolemia, tobacco use.  PCP - [facility]  ROS: Level 5 caveat secondary to acuity  PAST MEDICAL HISTORY/PAST SURGICAL HISTORY:  Past Medical History:  Diagnosis Date  . Anxiety   . Depression    no history of.  . Fibroid   . HSV-2 (herpes simplex virus 2) infection   . Multiple thyroid nodules    followed by pcp  . Osteopenia   . Rheumatic fever    no cardiology issues    MEDICATIONS:  Prior to Admission medications   Medication Sig Start Date End Date Taking? Authorizing Provider  alendronate (FOSAMAX) 70 MG tablet Take 70 mg by mouth every 7 (seven) days. Take with a full glass of water on an empty stomach.    [provider]  ALPRAZolam Duanne Moron) 0.5 MG tablet Take 0.5 mg by mouth daily as needed for sleep.  03/17/12   [provider]  aspirin 81 MG tablet Take 81 mg by mouth every morning.     [provider]  Calcium Carbonate (CALCIUM 600 PO) Take 1 tablet by mouth every morning.     [provider]  Cholecalciferol (VITAMIN D PO) Take 1 tablet by mouth every morning.     [provider]  Coenzyme Q10 (CO Q 10 PO) Take 1 tablet by mouth every morning.     [provider]  Fexofenadine HCl (MUCINEX ALLERGY PO) Take 800 mg by mouth every morning.    [provider]  Iodine, Kelp, (KELP PO) Take 1 tablet by mouth every morning.     [provider]   Multiple Vitamins-Minerals (MULTIVITAMIN PO) Take 1 tablet by mouth every morning.     [provider]  OVER THE COUNTER MEDICATION Take 2 capsules by mouth every morning. (Astraygalus)    [provider]  Probiotic Product (PROBIOTIC PO) Take 1 tablet by mouth every morning.     [provider]  rosuvastatin (CRESTOR) 5 MG tablet Take 5 mg by mouth every morning.    [provider]  TURMERIC PO Take 1 tablet by mouth every morning.     [provider]    ALLERGIES:  Allergies  Allergen Reactions  . Latex     Long period ... Red skin     SOCIAL HISTORY:  Social History   Tobacco Use  . Smoking status: Never Smoker  Substance Use Topics  . Alcohol use: Yes    Alcohol/week: 5.0 standard drinks    Types: 5 drink(s) per week    Comment: 2 wine glasses per day.    FAMILY HISTORY: Family History  Problem Relation Age of Onset  . Cancer Sister        ductal invasive cancer  . Breast cancer Sister 43  . Cancer Maternal Grandmother        colon  . Cancer Father  lung (smoker)  . Depression Sister   . Osteoporosis Sister     EXAM: BP 125/64 (BP Location: Right Arm)   Pulse 91   Temp 98 F (36.7 C) (Oral)   Resp 17   Ht 5\' 5"  (1.651 m)   Wt 55.3 kg   LMP 03/08/1992   SpO2 98%   BMI 20.30 kg/m  CONSTITUTIONAL: Alert and oriented and responds appropriately to questions. Well-appearing; well-nourished HEAD: Normocephalic, atraumatic EYES: Conjunctivae clear, pupils appear equal, EOM appear intact ENT: normal nose; moist mucous membranes NECK: Supple, normal ROM CARD: RRR; S1 and S2 appreciated; no murmurs, no clicks, no rubs, no gallops RESP: Normal chest excursion without splinting or tachypnea; breath sounds clear and equal bilaterally; no wheezes, no rhonchi, no rales, no hypoxia or respiratory distress, speaking full sentences ABD/GI: Normal bowel sounds; non-distended; soft, non-tender, no rebound, no guarding, no  peritoneal signs, no hepatosplenomegaly BACK:  The back appears normal EXT: Normal ROM in all joints; no deformity noted, no edema; no cyanosis SKIN: Normal color for age and race; warm; no rash on exposed skin NEURO: Moves all extremities equally, speech is clear, patient has diminished strength in the left upper extremity especially in the hand compared to the right, cranial nerves II through XII intact, normal strength in bilateral lower extremities, reports normal sensation diffusely, gait deferred PSYCH: The patient's mood and manner are appropriate.   MEDICAL DECISION MAKING: Patient here with left upper extremity numbness and weakness.  Dr. Cheral Marker with neurology has seen and is concerned for lacunar infarct.  Outside of TPA window.  Will discuss with medicine for admission once labs have resulted.  Noncontrast head CT showed no acute abnormality.  ED PROGRESS: Labs at this time unremarkable.  Ethanol level, drug screen pending.  Will discuss with hospitalist.  5:05 AM Discussed patient's case with hospitalist, Dr. Tonie Griffith.  I have recommended admission and patient (and family if present) agree with this plan. Admitting physician will place admission orders.   I reviewed all nursing notes, vitals, pertinent previous records and reviewed/interpreted all EKGs, lab and urine results, imaging (as available).  Alcohol level 197.     EKG Interpretation  Date/Time:  Wednesday September 19 2019 04:17:26 EDT Ventricular Rate:  91 PR Interval:    QRS Duration: 96 QT Interval:  367 QTC Calculation: 452 R Axis:   87 Text Interpretation: Sinus rhythm Borderline right axis deviation Confirmed by Pryor Curia [phone]) on 09/19/2019 4:18:11 AM       CRITICAL CARE Performed by: Cyril Mourning Betzy Barbier   Total critical care time: 45 minutes  Critical care time was exclusive of separately billable procedures and treating other patients.  Critical care was necessary to treat or prevent imminent or  life-threatening deterioration.  Critical care was time spent personally by me on the following activities: development of treatment plan with patient and/or surrogate as well as nursing, discussions with consultants, evaluation of patient's response to treatment, examination of patient, obtaining history from patient or surrogate, ordering and performing treatments and interventions, ordering and review of laboratory studies, ordering and review of radiographic studies, pulse oximetry and re-evaluation of patient's condition.   Brooke [name] was evaluated in Emergency Department on 09/19/2019 for the symptoms described in the history of present illness. She was evaluated in the context of the global COVID-19 pandemic, which necessitated consideration that the patient might be at risk for infection with the SARS-CoV-2 virus that causes COVID-19. Institutional protocols and algorithms that pertain to the evaluation of  patients at risk for COVID-19 are in a state of rapid change based on information released by regulatory bodies including the CDC and federal and state organizations. These policies and algorithms were followed during the patient's care in the ED.      Brooke [name], Delice Bison, DO 09/19/19 0505    Kemper Hochman, Delice Bison, DO 09/19/19 [id number]

## 2019-09-19 NOTE — ED Notes (Signed)
PT stated patient is cleared for discharge from her stand point.

## 2019-09-19 NOTE — ED Notes (Signed)
Pt ambulated to the bathroom to provide urine sample.

## 2019-09-19 NOTE — ED Triage Notes (Signed)
Pt arrived by EMS from home for left hand weakness and slurred speech. Pt lives alone, fell asleep at midnight on the couch then woke at 2am with weakness to L arm. CBG 111  Code stroke activated pta

## 2019-09-19 NOTE — Consult Note (Signed)
Referring Physician: Dr. Leonides Schanz    Chief Complaint: Acute onset of distal LUE weakness  HPI: Brooke [name] is an 79 y.o. right handed female with a PMHx of rheumatic fever, depression and anxiety who presents via EMS as a Code Stroke for acute onset of distal LUE weakness. EMS also noted what they felt was mild dysarthria. LKN per EMS was 1800 when per their understanding she fell asleep while watching TV. On further interview with Dr. Leonides Schanz and RN present, the patient endorses LKN as 10 PM when she was petting her cat, using both hands, just prior to falling asleep while watching TV. The symptoms were noticed by the patient on awakening, when she tried to pick something up with her left hand. She has no prior history of stroke. She denies headache or right sided symptoms.   She has no prior history of stroke. Home medications include ASA and rosuvastatin.   LSN: 1800 tPA Given: No: Out of time window.   Past Medical History:  Diagnosis Date   Anxiety    Depression    no history of.   Fibroid    HSV-2 (herpes simplex virus 2) infection    Multiple thyroid nodules    followed by pcp   Osteopenia    Rheumatic fever    no cardiology issues    Past Surgical History:  Procedure Laterality Date   CATARACT EXTRACTION Right    COLONOSCOPY WITH PROPOFOL N/A 01/08/2014   Procedure: COLONOSCOPY WITH PROPOFOL;  Surgeon: Garlan Fair, MD;  Location: [facility];  Service: Endoscopy;  Laterality: N/A;   PELVIC LAPAROSCOPY     [name]   TONSILLECTOMY     WRIST FRACTURE SURGERY Left    '11-hardware retained    Family History  Problem Relation Age of Onset   Cancer Sister        ductal invasive cancer   Breast cancer Sister 50   Cancer Maternal Grandmother        colon   Cancer Father        lung (smoker)   Depression Sister    Osteoporosis Sister    Social History:  reports that she has never smoked. She does not have any smokeless tobacco history on file. She  reports current alcohol use of about 5.0 standard drinks of alcohol per week. She reports that she does not use drugs.  Allergies:  Allergies  Allergen Reactions   Latex     Long period ... Red skin     Home Medications: No current facility-administered medications on file prior to encounter.   Current Outpatient Medications on File Prior to Encounter  Medication Sig Dispense Refill   alendronate (FOSAMAX) 70 MG tablet Take 70 mg by mouth every 7 (seven) days. Take with a full glass of water on an empty stomach.     ALPRAZolam (XANAX) 0.5 MG tablet Take 0.5 mg by mouth daily as needed for sleep.      aspirin 81 MG tablet Take 81 mg by mouth every morning.      Calcium Carbonate (CALCIUM 600 PO) Take 1 tablet by mouth every morning.      Cholecalciferol (VITAMIN D PO) Take 1 tablet by mouth every morning.      Coenzyme Q10 (CO Q 10 PO) Take 1 tablet by mouth every morning.      Fexofenadine HCl (MUCINEX ALLERGY PO) Take 800 mg by mouth every morning.     Iodine, Kelp, (KELP PO) Take 1 tablet by  mouth every morning.      Multiple Vitamins-Minerals (MULTIVITAMIN PO) Take 1 tablet by mouth every morning.      OVER THE COUNTER MEDICATION Take 2 capsules by mouth every morning. (Astraygalus)     Probiotic Product (PROBIOTIC PO) Take 1 tablet by mouth every morning.      rosuvastatin (CRESTOR) 5 MG tablet Take 5 mg by mouth every morning.     TURMERIC PO Take 1 tablet by mouth every morning.       ROS: As per HPI. Comprehensive ROS otherwise negative.   Physical Examination: Last menstrual period 03/08/1992.  HEENT: [place]/AT Lungs: Respirations unlabored Ext: No edema  Neurologic Examination: Mental Status: Awake and alert. Fully oriented. Speech fluent with intact naming, repetition and comprehension. Mild dysarthria is noted.  Cranial Nerves: II:  Visual fields intact bilaterally all 4 quadrants. No extinction to DSS. PERRL.  III,IV, VI: No ptosis. EOMI.  V,VII: No  facial droop. Facial temp sensation equal bilaterally VIII: Hearing intact to voice IX,X: No hoarseness or hypophonia XI: Symmetric XII: midline tongue extension  Motor: RUE and RLE: 5/5 proximally and distally.  LUE: 5/5 deltoid, 4+/5 biceps and triceps, 1/5 wrist extension, 3/5 wrist flexion, 1/5 finger abduction and extension, 3/5 grip LLE 5/5 proximally and distally  Sensory: Temp and light touch sensation intact x 4. No extinction to DSS. Deep Tendon Reflexes:  2+ bilateral brachioradialis, biceps and patellae.  Plantars: Mute bilaterally  Cerebellar: No ataxia with right FNF. There is moderate ataxia with FNF on the left.  Normal H-S on the right. Mild ataxia with left H-S. Gait: Deferred  Results for orders placed or performed during the hospital encounter of 09/19/19 (from the past 48 hour(s))  CBG monitoring, ED     Status: None   Collection Time: 09/19/19  3:29 AM  Result Value Ref Range   Glucose-Capillary 94 70 - 99 mg/dL    Comment: Glucose reference range applies only to samples taken after fasting for at least 8 hours.  I-stat chem 8, ED     Status: Abnormal   Collection Time: 09/19/19  3:39 AM  Result Value Ref Range   Sodium 143 135 - 145 mmol/L   Potassium 4.2 3.5 - 5.1 mmol/L   Chloride 103 98 - 111 mmol/L   BUN 16 8 - 23 mg/dL   Creatinine, Ser 0.90 0.44 - 1.00 mg/dL   Glucose, Bld 101 (H) 70 - 99 mg/dL    Comment: Glucose reference range applies only to samples taken after fasting for at least 8 hours.   Calcium, Ion 1.16 1.15 - 1.40 mmol/L   TCO2 24 22 - 32 mmol/L   Hemoglobin 13.6 12.0 - 15.0 g/dL   HCT 40.0 36 - 46 %   No results found.  Assessment: 79 y.o. female presenting with acute onset of LUE weakness and left sided ataxia.  1. Exam findings best localize as a right thalamic lesion, most likely an acute lacunar infarction.  2. The patient is not a tPA candidate due to time criteria. 3. The patient is not a candidate for VIR as the clinical  findings are not consistent with LVO.  4. Stroke Risk Factors - None.  Recommendations: 1. HgbA1c, fasting lipid panel 2. CTA of the head and neck 3. MRI of the brain without contrast 4. Echocardiogram 5. Cardiac telemetry.  6. Prophylactic therapy- Increase ASA to 325 mg po qd for now. May need to add Plavix pending results of stroke workup.  7. Risk  factor modification 8. Frequent neuro checks 9. PT consult, OT consult, Speech consult 10. Continue low dose statin for now. May need to change to intermediate or high dose pending results of stroke work up 47. Permissive HTN x 24 hours.     @Electronically  signed: Dr. Kerney Elbe  09/19/2019, 3:43 AM

## 2019-09-19 NOTE — ED Notes (Signed)
Patient transported to MRI 

## 2019-09-19 NOTE — Care Management (Signed)
ED CM met with patient at bedside to discuss consult for OP Rehab patient is agreeable.  Referral sent to [facility] to arrange OP PT, information also placed on AVS to contact the office if she has not received a call to arrange the services within 48 hours contact the office. Patient verbalized understanding. No further ED CM needs identified.

## 2019-09-19 NOTE — Progress Notes (Signed)
STROKE TEAM PROGRESS NOTE   INTERVAL HISTORY No family at the bedside. Patient sitting in bed, stated that her left wrist not able to lift it up and her fingers also has difficulty straightening out. She also complaining of lateral left forearm and left lateral dorsum of the hand slight numbness. She stated that she was using computer and then fall asleep for probably 2 to 3 hours and then when she woke up she had a symptoms. She cannot remember what positioning of the left arm during the sleep but she denies any object compressing her left armpit.  Vitals:   09/19/19 0354 09/19/19 0420 09/19/19 0530  BP:  125/64 131/62  Pulse:  91 96  Resp:  17 (!) 22  Temp:  98 F (36.7 C)   TempSrc:  Oral   SpO2:  98% 97%  Weight: 55.3 kg    Height: 5\' 5"  (1.651 m)     CBC:  Recent Labs  Lab 09/19/19 0339 09/19/19 0422  WBC  --  5.0  NEUTROABS  --  2.2  HGB 13.6 14.9  HCT 40.0 45.3  MCV  --  97.2  PLT  --  409   Basic Metabolic Panel:  Recent Labs  Lab 09/19/19 0339 09/19/19 0422  NA 143 140  K 4.2 4.0  CL 103 101  CO2  --  24  GLUCOSE 101* 104*  BUN 16 15  CREATININE 0.90 0.67  CALCIUM  --  10.2   Lipid Panel:  Recent Labs  Lab 09/19/19 0422  CHOL 262*  TRIG 161*  HDL 95  CHOLHDL 2.8  VLDL 32  LDLCALC 135*   HgbA1c:  Recent Labs  Lab 09/19/19 0422  HGBA1C 5.3   Urine Drug Screen:  Recent Labs  Lab 09/19/19 0428  LABOPIA NONE DETECTED  COCAINSCRNUR NONE DETECTED  LABBENZ NONE DETECTED  AMPHETMU NONE DETECTED  THCU NONE DETECTED  LABBARB NONE DETECTED    Alcohol Level  Recent Labs  Lab 09/19/19 0422  ETH 197*    IMAGING past 24 hours CT Code Stroke CTA Head W/WO contrast  Result Date: 09/19/2019 CLINICAL DATA:  Left-sided weakness and slurred speech EXAM: CT ANGIOGRAPHY HEAD AND NECK TECHNIQUE: Multidetector CT imaging of the head and neck was performed using the standard protocol during bolus administration of intravenous contrast. Multiplanar CT  image reconstructions and MIPs were obtained to evaluate the vascular anatomy. Carotid stenosis measurements (when applicable) are obtained utilizing NASCET criteria, using the distal internal carotid diameter as the denominator. CONTRAST:  147mL OMNIPAQUE IOHEXOL 350 MG/ML SOLN COMPARISON:  Noncontrast head CT earlier today FINDINGS: CTA NECK FINDINGS Aortic arch: Atheromatous plaque. No dilatation. Three vessel branching. Right carotid system: Limited atheromatous changes. No stenosis, ulceration, or beading. Left carotid system: Limited atheromatous changes. No stenosis, ulceration, or beading. Vertebral arteries: The vertebral arteries are smooth and widely patent. Mild right vertebral artery dominance. No proximal subclavian stenosis. Skeleton: No acute or aggressive finding Other neck: Bilateral cataract resection. Upper chest: Negative Review of the MIP images confirms the above findings CTA HEAD FINDINGS Anterior circulation: Mild atheromatous calcification at the carotid siphons. No stenosis, beading, or aneurysm. Posterior circulation: The vertebral and basilar arteries are smooth and widely patent. No branch occlusion or beading. Negative for aneurysm. Venous sinuses: Patent Anatomic variants: None significant Review of the MIP images confirms the above findings IMPRESSION: 1. Unremarkable CTA of the head and neck for age. 2. Overall mild atherosclerosis. Electronically Signed   By: Neva Seat.D.  On: 09/19/2019 05:56   CT Code Stroke CTA Neck W/WO contrast  Result Date: 09/19/2019 CLINICAL DATA:  Left-sided weakness and slurred speech EXAM: CT ANGIOGRAPHY HEAD AND NECK TECHNIQUE: Multidetector CT imaging of the head and neck was performed using the standard protocol during bolus administration of intravenous contrast. Multiplanar CT image reconstructions and MIPs were obtained to evaluate the vascular anatomy. Carotid stenosis measurements (when applicable) are obtained utilizing NASCET  criteria, using the distal internal carotid diameter as the denominator. CONTRAST:  136mL OMNIPAQUE IOHEXOL 350 MG/ML SOLN COMPARISON:  Noncontrast head CT earlier today FINDINGS: CTA NECK FINDINGS Aortic arch: Atheromatous plaque. No dilatation. Three vessel branching. Right carotid system: Limited atheromatous changes. No stenosis, ulceration, or beading. Left carotid system: Limited atheromatous changes. No stenosis, ulceration, or beading. Vertebral arteries: The vertebral arteries are smooth and widely patent. Mild right vertebral artery dominance. No proximal subclavian stenosis. Skeleton: No acute or aggressive finding Other neck: Bilateral cataract resection. Upper chest: Negative Review of the MIP images confirms the above findings CTA HEAD FINDINGS Anterior circulation: Mild atheromatous calcification at the carotid siphons. No stenosis, beading, or aneurysm. Posterior circulation: The vertebral and basilar arteries are smooth and widely patent. No branch occlusion or beading. Negative for aneurysm. Venous sinuses: Patent Anatomic variants: None significant Review of the MIP images confirms the above findings IMPRESSION: 1. Unremarkable CTA of the head and neck for age. 2. Overall mild atherosclerosis. Electronically Signed   By: Monte Fantasia M.D.   On: 09/19/2019 05:56   CT HEAD CODE STROKE WO CONTRAST  Result Date: 09/19/2019 CLINICAL DATA:  Code stroke. 79 year old female with left side weakness and slurred speech. EXAM: CT HEAD WITHOUT CONTRAST TECHNIQUE: Contiguous axial images were obtained from the base of the skull through the vertex without intravenous contrast. COMPARISON:  Head CT 02/01/2010. FINDINGS: Brain: No midline shift, mass effect, or evidence of intracranial mass lesion. No ventriculomegaly. Mild for age periventricular white matter hypodensity. No acute cortically based infarct or cortical encephalomalacia identified. No acute intracranial hemorrhage identified. Vascular: Mild  Calcified atherosclerosis at the skull base. No suspicious intracranial vascular hyperdensity. Skull: No acute osseous abnormality identified. Sinuses/Orbits: Occasional mild mucosal thickening. Generally well pneumatized sinuses. Tympanic cavities and mastoids are clear. Other: No acute orbit or scalp soft tissue finding. ASPECTS [facility] Stroke Program Early CT Score) Total score (0-10 with 10 being normal): 10 IMPRESSION: 1. No acute cortically based infarct or acute intracranial hemorrhage identified. ASPECTS 10. 2. Mild for age nonspecific cerebral white matter disease. 3. These results were communicated to Dr. Cheral Marker at 3:44 amon 7/14/2021by text page via the [facility] messaging system. Electronically Signed   By: Genevie Ann M.D.   On: 09/19/2019 03:44    PHYSICAL EXAM  Temp:  [98 F (36.7 C)-98.3 F (36.8 C)] 98.3 F (36.8 C) (07/14 1458) Pulse Rate:  [91-102] 97 (07/14 1458) Resp:  [15-22] 15 (07/14 1458) BP: (125-162)/(58-122) 132/58 (07/14 1458) SpO2:  [97 %-99 %] 97 % (07/14 1458) Weight:  [55.3 kg] 55.3 kg (07/14 0354)  General - Well nourished, well developed, in no apparent distress.  Ophthalmologic - fundi not visualized due to noncooperation.  Cardiovascular - Regular rhythm and rate.  Mental Status -  Level of arousal and orientation to time, place, and person were intact. Language including expression, naming, repetition, comprehension was assessed and found intact. Fund of Knowledge was assessed and was intact.  Cranial Nerves II - XII - II - Visual field intact OU. III, IV, VI - Extraocular  movements intact. V - Facial sensation intact bilaterally. VII - Facial movement intact bilaterally. VIII - Hearing & vestibular intact bilaterally. X - Palate elevates symmetrically. XI - Chin turning & shoulder shrug intact bilaterally. XII - Tongue protrusion intact.  Motor Strength - The patients strength was normal in all extremities and pronator drift was absent except left  wrist drop and difficulty with extension of left fingers, 4/5 of left brachioradialis.  Bulk was normal and fasciculations were absent.   Motor Tone - Muscle tone was assessed at the neck and appendages and was normal.  Reflexes - The patients reflexes were symmetrical in all extremities and she had no pathological reflexes.  Sensory - Light touch, temperature/pinprick were assessed and were symmetrical except mild numbness at lateral left forearm and left dorsal lateral hand.    Coordination - The patient had normal movements in the hands with no ataxia or dysmetria.  Tremor was absent.  Gait and Station - deferred.   ASSESSMENT/PLAN Brooke [name] is a 79 y.o. female with history of rheumatic fever, depression and anxiety presenting with distal LUE weakness.   L radial neuropathy with wrist Drop  Code Stroke CT head No acute abnormality. Small vessel disease. ASPECTS 10.     CTA head & neck Unremarkable x mild atherosclerosis   MRI  No acute abnormality  2D Echo EF 55 to 60%  LDL 135  HgbA1c 5.3  VTE prophylaxis - none  No antithrombotic prior to admission, now on No antithrombotic.   Need outpatient EMG study, will refer to Dr. Krista Blue at [facility]  Therapy recommendations:  OP PT to consider left hand brace  Disposition:  Return home  Hyperlipidemia  Home meds:  crestor 5, resumed in ED  LDL 135  Continue statin at discharge  Other Stroke Risk Factors  Advanced age  ETOH abuse, alcohol level 197, advised to drink no more than 1 drink(s) a day  Estrace vaginal cream   Other Active Problems  Multiple thyroid nodules  Hx rheumatic fever  HSV-2  Hospital day # 0  Neurology will sign off. Please call with questions. We will refer to Dr. Krista Blue at [facility] to be seen in about 1-2 weeks. Thanks for the consult.  Rosalin Hawking, MD PhD Stroke Neurology 09/19/2019 8:28 PM  To contact Stroke Continuity provider, please refer to [email or web address] After hours, contact  General Neurology

## 2019-09-20 ENCOUNTER — Other Ambulatory Visit: Payer: Self-pay | Admitting: Geriatric Medicine

## 2019-09-20 DIAGNOSIS — M810 Age-related osteoporosis without current pathological fracture: Secondary | ICD-10-CM

## 2019-09-26 ENCOUNTER — Ambulatory Visit: Payer: Medicare Other | Admitting: Physical Therapy

## 2019-11-06 ENCOUNTER — Encounter: Payer: Self-pay | Admitting: Neurology

## 2019-11-06 ENCOUNTER — Ambulatory Visit: Payer: Medicare Other | Admitting: Neurology

## 2019-11-06 ENCOUNTER — Ambulatory Visit (INDEPENDENT_AMBULATORY_CARE_PROVIDER_SITE_OTHER): Payer: Medicare Other | Admitting: Neurology

## 2019-11-06 ENCOUNTER — Other Ambulatory Visit: Payer: Self-pay

## 2019-11-06 DIAGNOSIS — G5631 Lesion of radial nerve, right upper limb: Secondary | ICD-10-CM | POA: Diagnosis not present

## 2019-11-06 HISTORY — DX: Lesion of radial nerve, right upper limb: G56.31

## 2019-11-06 NOTE — Procedures (Signed)
     HISTORY:  Brooke [name] is a 79 year old patient who was admitted to the hospital on 19 September 2019 with a left wrist drop.  The patient had been drinking alcohol the night before and had fallen asleep in a recliner.  She woke up with the wrist drop the next morning.  She is being evaluated for a possible neuropathy.  NERVE CONDUCTION STUDIES:  Nerve conduction studies were performed on the left upper extremity.  The distal motor latencies for the left median, ulnar, and radial nerves were normal with normal motor amplitudes for these nerves and normal nerve conduction velocities for these nerves.  The left radial, median, and ulnar sensory latencies were normal.  The F-wave latency for the left ulnar nerve was normal.  EMG STUDIES:  EMG study was performed on the left upper extremity:  The first dorsal interosseous muscle reveals 2 to 4 K units with full recruitment. No fibrillations or positive waves were noted. The abductor pollicis brevis muscle reveals 2 to 4 K units with full recruitment. No fibrillations or positive waves were noted. The extensor indicis proprius muscle reveals 1 to 2 K units with markedly reduced recruitment. 3+ fibrillations and positive waves were noted. The brachioradialis muscle reveals 1 to 2 K units with markedly reduced recruitment.  3+ fibrillations and positive waves were noted. The pronator teres muscle reveals 2 to 3 K units with full recruitment. No fibrillations or positive waves were noted. The biceps muscle reveals 1 to 2 K units with full recruitment. No fibrillations or positive waves were noted. The triceps muscle reveals 2 to 4 K units with full recruitment. No fibrillations or positive waves were noted. The anterior deltoid muscle reveals 2 to 3 K units with full recruitment. No fibrillations or positive waves were noted. The cervical paraspinal muscles were tested at 2 levels. No abnormalities of insertional activity were seen at either level  tested. There was good relaxation.   IMPRESSION:  Nerve conduction studies done on the left upper extremity were unremarkable.  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  No evidence of a cervical radiculopathy was seen.  Jill Alexanders MD 11/06/2019 1:50 PM  [facility] [address] [place], [place] [address]  Phone [phone] Fax [phone]

## 2019-11-06 NOTE — Progress Notes (Signed)
Please refer to EMG and nerve conduction procedure note.  

## 2019-11-07 NOTE — Progress Notes (Signed)
Walker    Nerve / Sites Muscle Latency Ref. Amplitude Ref. Rel Amp Segments Distance Velocity Ref. Area    ms ms mV mV %  cm m/s m/s mVms  L Median - APB     Wrist APB 4.2 ?4.4 5.4 ?4.0 100 Wrist - APB 7   18.8     Upper arm APB 8.2  5.3  97 Upper arm - Wrist 21 53 ?49 18.3  L Ulnar - ADM     Wrist ADM 3.3 ?3.3 9.7 ?6.0 100 Wrist - ADM 7   31.3     B.Elbow ADM 7.4  8.5  87.8 B.Elbow - Wrist 21 52 ?49 29.8     A.Elbow ADM 9.3  8.0  93.5 A.Elbow - B.Elbow 10 52 ?49 28.7         A.Elbow - Wrist      L Radial - EIP     Forearm EIP 2.3 ?2.9 3.7 ?2.0 100 Forearm - EIP 4  ?49 22.2     Elbow EIP 4.7  3.6  96.9 Elbow - Forearm 15 62  25.5     Spiral Gr EIP 6.6  3.7  105 Spiral Gr - Elbow 12 63  20.1           SNC    Nerve / Sites Rec. Site Peak Lat Ref.  Amp Ref. Segments Distance Peak Diff Ref.    ms ms V V  cm ms ms  L Radial - Anatomical snuff box (Forearm)     Forearm Wrist 2.9 ?2.9 23 ?15 Forearm - Wrist 10    L Median, Ulnar - Transcarpal comparison     Median Palm Wrist 2.3 ?2.2 150 ?35 Median Palm - Wrist 8       Ulnar Palm Wrist 2.2 ?2.2 51 ?12 Ulnar Palm - Wrist 8          Median Palm - Ulnar Palm  0.0 ?0.4  L Median - Orthodromic (Dig II, Mid palm)     Dig II Wrist 3.4 ?3.4 19 ?10 Dig II - Wrist 13    L Ulnar - Orthodromic, (Dig V, Mid palm)     Dig V Wrist 3.1 ?3.1 12 ?5 Dig V - Wrist 83               F  Wave    Nerve F Lat Ref.   ms ms  L Ulnar - ADM 31.0 ?32.0

## 2019-12-20 ENCOUNTER — Ambulatory Visit
Admission: RE | Admit: 2019-12-20 | Discharge: 2019-12-20 | Disposition: A | Discharge status: Home / Self Care | Payer: Medicare Other | Source: Ambulatory Visit | Attending: Geriatric Medicine | Admitting: Geriatric Medicine

## 2019-12-20 ENCOUNTER — Other Ambulatory Visit: Payer: Self-pay

## 2019-12-20 DIAGNOSIS — M810 Age-related osteoporosis without current pathological fracture: Secondary | ICD-10-CM

## 2020-01-14 ENCOUNTER — Encounter (INDEPENDENT_AMBULATORY_CARE_PROVIDER_SITE_OTHER): Payer: Medicare Other | Admitting: Ophthalmology

## 2020-02-04 ENCOUNTER — Encounter (INDEPENDENT_AMBULATORY_CARE_PROVIDER_SITE_OTHER): Payer: Medicare Other | Admitting: Ophthalmology

## 2020-02-06 ENCOUNTER — Encounter (INDEPENDENT_AMBULATORY_CARE_PROVIDER_SITE_OTHER): Payer: Medicare Other | Admitting: Ophthalmology

## 2020-02-06 ENCOUNTER — Other Ambulatory Visit: Payer: Self-pay

## 2020-02-06 DIAGNOSIS — H43813 Vitreous degeneration, bilateral: Secondary | ICD-10-CM

## 2020-02-06 DIAGNOSIS — H33303 Unspecified retinal break, bilateral: Secondary | ICD-10-CM

## 2020-02-06 DIAGNOSIS — H35373 Puckering of macula, bilateral: Secondary | ICD-10-CM

## 2020-04-23 DIAGNOSIS — E7800 Pure hypercholesterolemia, unspecified: Secondary | ICD-10-CM | POA: Diagnosis not present

## 2020-04-23 DIAGNOSIS — M810 Age-related osteoporosis without current pathological fracture: Secondary | ICD-10-CM | POA: Diagnosis not present

## 2020-05-12 ENCOUNTER — Other Ambulatory Visit: Payer: Self-pay | Admitting: Geriatric Medicine

## 2020-05-12 DIAGNOSIS — Z1231 Encounter for screening mammogram for malignant neoplasm of breast: Secondary | ICD-10-CM

## 2020-05-30 DIAGNOSIS — D2271 Melanocytic nevi of right lower limb, including hip: Secondary | ICD-10-CM | POA: Diagnosis not present

## 2020-05-30 DIAGNOSIS — D2272 Melanocytic nevi of left lower limb, including hip: Secondary | ICD-10-CM | POA: Diagnosis not present

## 2020-05-30 DIAGNOSIS — D2261 Melanocytic nevi of right upper limb, including shoulder: Secondary | ICD-10-CM | POA: Diagnosis not present

## 2020-05-30 DIAGNOSIS — L821 Other seborrheic keratosis: Secondary | ICD-10-CM | POA: Diagnosis not present

## 2020-05-30 DIAGNOSIS — D1801 Hemangioma of skin and subcutaneous tissue: Secondary | ICD-10-CM | POA: Diagnosis not present

## 2020-05-30 DIAGNOSIS — D225 Melanocytic nevi of trunk: Secondary | ICD-10-CM | POA: Diagnosis not present

## 2020-06-18 DIAGNOSIS — M810 Age-related osteoporosis without current pathological fracture: Secondary | ICD-10-CM | POA: Diagnosis not present

## 2020-06-18 DIAGNOSIS — E7800 Pure hypercholesterolemia, unspecified: Secondary | ICD-10-CM | POA: Diagnosis not present

## 2020-07-03 ENCOUNTER — Ambulatory Visit: Payer: Medicare Other

## 2020-08-12 ENCOUNTER — Ambulatory Visit: Payer: Medicare Other

## 2020-08-20 DIAGNOSIS — M810 Age-related osteoporosis without current pathological fracture: Secondary | ICD-10-CM | POA: Diagnosis not present

## 2020-09-22 DIAGNOSIS — E7800 Pure hypercholesterolemia, unspecified: Secondary | ICD-10-CM | POA: Diagnosis not present

## 2020-09-22 DIAGNOSIS — Z1389 Encounter for screening for other disorder: Secondary | ICD-10-CM | POA: Diagnosis not present

## 2020-09-22 DIAGNOSIS — Z79899 Other long term (current) drug therapy: Secondary | ICD-10-CM | POA: Diagnosis not present

## 2020-09-22 DIAGNOSIS — Z0000 Encounter for general adult medical examination without abnormal findings: Secondary | ICD-10-CM | POA: Diagnosis not present

## 2020-09-22 DIAGNOSIS — M810 Age-related osteoporosis without current pathological fracture: Secondary | ICD-10-CM | POA: Diagnosis not present

## 2020-10-06 ENCOUNTER — Ambulatory Visit: Payer: Medicare Other

## 2020-10-07 ENCOUNTER — Other Ambulatory Visit: Payer: Self-pay

## 2020-10-07 ENCOUNTER — Ambulatory Visit
Admission: RE | Admit: 2020-10-07 | Discharge: 2020-10-07 | Disposition: A | Discharge status: Home / Self Care | Payer: Medicare Other | Source: Ambulatory Visit | Attending: Geriatric Medicine | Admitting: Geriatric Medicine

## 2020-10-07 ENCOUNTER — Ambulatory Visit: Payer: Medicare Other

## 2020-10-07 DIAGNOSIS — E7800 Pure hypercholesterolemia, unspecified: Secondary | ICD-10-CM | POA: Diagnosis not present

## 2020-10-07 DIAGNOSIS — Z1231 Encounter for screening mammogram for malignant neoplasm of breast: Secondary | ICD-10-CM

## 2020-10-07 DIAGNOSIS — M810 Age-related osteoporosis without current pathological fracture: Secondary | ICD-10-CM | POA: Diagnosis not present

## 2020-12-18 DIAGNOSIS — R3915 Urgency of urination: Secondary | ICD-10-CM | POA: Diagnosis not present

## 2020-12-26 DIAGNOSIS — M810 Age-related osteoporosis without current pathological fracture: Secondary | ICD-10-CM | POA: Diagnosis not present

## 2020-12-26 DIAGNOSIS — E7800 Pure hypercholesterolemia, unspecified: Secondary | ICD-10-CM | POA: Diagnosis not present

## 2021-02-05 ENCOUNTER — Encounter (INDEPENDENT_AMBULATORY_CARE_PROVIDER_SITE_OTHER): Payer: Medicare Other | Admitting: Ophthalmology

## 2021-02-20 ENCOUNTER — Other Ambulatory Visit: Payer: Self-pay

## 2021-02-20 ENCOUNTER — Encounter (INDEPENDENT_AMBULATORY_CARE_PROVIDER_SITE_OTHER): Payer: Medicare Other | Admitting: Ophthalmology

## 2021-02-20 DIAGNOSIS — H353112 Nonexudative age-related macular degeneration, right eye, intermediate dry stage: Secondary | ICD-10-CM | POA: Diagnosis not present

## 2021-02-20 DIAGNOSIS — H35372 Puckering of macula, left eye: Secondary | ICD-10-CM

## 2021-02-20 DIAGNOSIS — H33303 Unspecified retinal break, bilateral: Secondary | ICD-10-CM

## 2021-02-20 DIAGNOSIS — H43813 Vitreous degeneration, bilateral: Secondary | ICD-10-CM

## 2021-02-23 DIAGNOSIS — M810 Age-related osteoporosis without current pathological fracture: Secondary | ICD-10-CM | POA: Diagnosis not present

## 2021-04-09 DIAGNOSIS — D2272 Melanocytic nevi of left lower limb, including hip: Secondary | ICD-10-CM | POA: Diagnosis not present

## 2021-04-09 DIAGNOSIS — D1801 Hemangioma of skin and subcutaneous tissue: Secondary | ICD-10-CM | POA: Diagnosis not present

## 2021-04-09 DIAGNOSIS — L603 Nail dystrophy: Secondary | ICD-10-CM | POA: Diagnosis not present

## 2021-04-09 DIAGNOSIS — D2271 Melanocytic nevi of right lower limb, including hip: Secondary | ICD-10-CM | POA: Diagnosis not present

## 2021-04-09 DIAGNOSIS — D225 Melanocytic nevi of trunk: Secondary | ICD-10-CM | POA: Diagnosis not present

## 2021-04-09 DIAGNOSIS — L218 Other seborrheic dermatitis: Secondary | ICD-10-CM | POA: Diagnosis not present

## 2021-04-09 DIAGNOSIS — D2262 Melanocytic nevi of left upper limb, including shoulder: Secondary | ICD-10-CM | POA: Diagnosis not present

## 2021-04-09 DIAGNOSIS — L308 Other specified dermatitis: Secondary | ICD-10-CM | POA: Diagnosis not present

## 2021-04-09 DIAGNOSIS — D2261 Melanocytic nevi of right upper limb, including shoulder: Secondary | ICD-10-CM | POA: Diagnosis not present

## 2021-04-09 DIAGNOSIS — L821 Other seborrheic keratosis: Secondary | ICD-10-CM | POA: Diagnosis not present

## 2021-06-24 DIAGNOSIS — I10 Essential (primary) hypertension: Secondary | ICD-10-CM | POA: Diagnosis not present

## 2021-09-10 DIAGNOSIS — M810 Age-related osteoporosis without current pathological fracture: Secondary | ICD-10-CM | POA: Diagnosis not present

## 2021-09-29 DIAGNOSIS — I10 Essential (primary) hypertension: Secondary | ICD-10-CM | POA: Diagnosis not present

## 2021-09-29 DIAGNOSIS — E7800 Pure hypercholesterolemia, unspecified: Secondary | ICD-10-CM | POA: Diagnosis not present

## 2021-09-29 DIAGNOSIS — Z0000 Encounter for general adult medical examination without abnormal findings: Secondary | ICD-10-CM | POA: Diagnosis not present

## 2021-09-29 DIAGNOSIS — Z79899 Other long term (current) drug therapy: Secondary | ICD-10-CM | POA: Diagnosis not present

## 2021-10-06 DIAGNOSIS — Z9189 Other specified personal risk factors, not elsewhere classified: Secondary | ICD-10-CM | POA: Diagnosis not present

## 2021-10-27 NOTE — Progress Notes (Unsigned)
Brooke [name] [address] [place] Phone: [phone] Subjective:   IVilma [name], am serving as a scribe for Dr. Hulan Saas.  I'm seeing this patient by the request  of:  Lajean Manes, MD  CC: finger pain   [id number]  Brooke [name] is a 81 y.o. female coming in with complaint of finger pain. Last seen in 2021 for shoulder pain. Patient states pinky finger on the left hand. Can't straighten DIP. Hit hand on counter.  Since then has had pain.  May be slowly getting better but not improving with any of the strength.       Past Medical History:  Diagnosis Date   Anxiety    Depression    no history of.   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   "Saturday night palsy"   Osteopenia    Rheumatic fever    no cardiology issues   Past Surgical History:  Procedure Laterality Date   CATARACT EXTRACTION Right    COLONOSCOPY WITH PROPOFOL N/A 01/08/2014   Procedure: COLONOSCOPY WITH PROPOFOL;  Surgeon: Brooke Fair, MD;  Location: [facility];  Service: Endoscopy;  Laterality: N/A;   PELVIC LAPAROSCOPY     [name]   TONSILLECTOMY     WRIST FRACTURE SURGERY Left    '11-hardware retained   Social History   Socioeconomic History   Marital status: Divorced    Spouse name: Not on file   Number of children: Not on file   Years of education: Not on file   Highest education level: Not on file  Occupational History   Not on file  Tobacco Use   Smoking status: Never   Smokeless tobacco: Never  Substance and Sexual Activity   Alcohol use: Yes    Alcohol/week: 5.0 standard drinks of alcohol    Types: 5 Standard drinks or equivalent per week    Comment: 2 wine glasses per day.   Drug use: No   Sexual activity: Yes    Partners: Male    Comment: husband vasectomy  Other Topics Concern   Not on  file  Social History Narrative   Not on file   Social Determinants of Health   Financial Resource Strain: Not on file  Food Insecurity: Not on file  Transportation Needs: Not on file  Physical Activity: Not on file  Stress: Not on file  Social Connections: Not on file   Allergies  Allergen Reactions   Latex     Long period ... Red skin    Family History  Problem Relation Age of Onset   Cancer Sister        ductal invasive cancer   Breast cancer Sister 64   Cancer Maternal Grandmother        colon   Cancer Father        lung (smoker)   Depression Sister    Osteoporosis Sister     Current Outpatient Medications (Endocrine & Metabolic):    denosumab (PROLIA) 60 MG/ML SOSY injection, Inject 60 mg into the skin every 6 (six) months.  Current Outpatient Medications (Cardiovascular):    rosuvastatin (CRESTOR) 5 MG tablet, Take 2 tablets (10 mg total) by mouth daily.  Current Outpatient Medications (Respiratory):    guaiFENesin (MUCINEX) 600 MG 12 hr tablet, Take 600 mg  by mouth 2 (two) times daily as needed for cough.   loratadine (CLARITIN) 10 MG tablet, Take 10 mg by mouth daily as needed for allergies.  Current Outpatient Medications (Analgesics):    naproxen sodium (ALEVE) 220 MG tablet, Take 220 mg by mouth 2 (two) times daily as needed (for pain).   Current Outpatient Medications (Other):    ALPRAZolam (XANAX) 0.5 MG tablet, Take 0.5 mg by mouth daily as needed for sleep.    calcium-vitamin D (OSCAL WITH D) 500-200 MG-UNIT tablet, Take 1 tablet by mouth 2 (two) times daily.   Cholecalciferol (VITAMIN D) 50 MCG (2000 UT) tablet, Take 2,000 Units by mouth daily.   clobetasol cream (TEMOVATE) 6.75 %, Apply 1 application topically 2 (two) times daily as needed (for rash).   diclofenac Sodium (VOLTAREN) 1 % GEL, Apply 2 g topically 4 (four) times daily as needed (for pain).   estradiol (ESTRACE) 0.1 MG/GM vaginal cream, Place 1 Applicatorful vaginally 2 (two) times a  week.   Kelp 100 MG TABS, Take 100 mg by mouth daily.   melatonin 3 MG TABS tablet, Take 3 mg by mouth at bedtime.   Misc Natural Products (COSAMIN ASU ADVANCED FORMULA PO), Take 1 tablet by mouth daily.   Misc Natural Products (TART CHERRY ADVANCED PO), Take 1 tablet by mouth daily.   Turmeric 500 MG TABS, Take 500 mg by mouth daily.   Reviewed prior external information including notes and imaging from  primary care provider As well as notes that were available from care everywhere and other healthcare systems.  Past medical history, social, surgical and family history all reviewed in electronic medical record.  No pertanent information unless stated regarding to the chief complaint.   Review of Systems:  No headache, visual changes, nausea, vomiting, diarrhea, constipation, dizziness, abdominal pain, skin rash, fevers, chills, night sweats, weight loss, swollen lymph nodes, body aches, joint swelling, chest pain, shortness of breath, mood changes. POSITIVE muscle aches  Objective  Blood pressure 126/74, pulse (!) 102, height '5\' 5"'$  (1.651 m), weight 133 lb (60.3 kg), last menstrual period 03/08/1992, SpO2 98 %.   General: No apparent distress alert and oriented x3 mood and affect normal, dressed appropriately.  HEENT: Pupils equal, extraocular movements intact  Respiratory: Patient's speak in full sentences and does not appear short of breath  Cardiovascular: No lower extremity edema, non tender, no erythema  Finger exam shows patient does have flexion noted of the DIP of the fifth on the left side.  Patient has no extension against resistance or even gravity of the DIP.  Full flexion noted.  Neurovascularly intact.   Limited muscular skeletal ultrasound was performed and interpreted by Hulan Saas, M  Hypoechoic changes show the patient does have what appears to be likely an extensor tendon rupture.  Mild cortical irregularity that is consistent with a mild avulsion fracture noted.   Retraction noted as well Impression: Extensor tendon rupture of the fifth DIP   Impression and Recommendations:    The above documentation has been reviewed and is accurate and complete Brooke Pulley, DO

## 2021-10-28 ENCOUNTER — Ambulatory Visit: Payer: Self-pay

## 2021-10-28 ENCOUNTER — Ambulatory Visit: Payer: Medicare Other | Admitting: Family Medicine

## 2021-10-28 ENCOUNTER — Encounter: Payer: Self-pay | Admitting: Family Medicine

## 2021-10-28 VITALS — BP 126/74 | HR 102 | Ht 65.0 in | Wt 133.0 lb

## 2021-10-28 DIAGNOSIS — S66812A Strain of other specified muscles, fascia and tendons at wrist and hand level, left hand, initial encounter: Secondary | ICD-10-CM | POA: Diagnosis not present

## 2021-10-28 DIAGNOSIS — M79645 Pain in left finger(s): Secondary | ICD-10-CM

## 2021-10-28 NOTE — Patient Instructions (Signed)
Stack splint Try not to flex for 5-6 weeks

## 2021-10-28 NOTE — Assessment & Plan Note (Signed)
Fifth finger DIP.  Patient has no extension at the moment.  Has been 3 weeks since the injury.  Discussed different treatment options and patient did elect to try a stack splint for 6 weeks.  Warned of avoiding any significant flexion without any of the bracing.  Follow-up again in 5 to 6 weeks to see if patient is doing and then will consider the possibility of occupational therapy if necessary.  Patient is in agreement with the plan and will follow-up at that time

## 2021-11-09 DIAGNOSIS — N390 Urinary tract infection, site not specified: Secondary | ICD-10-CM | POA: Diagnosis not present

## 2021-11-09 DIAGNOSIS — R319 Hematuria, unspecified: Secondary | ICD-10-CM | POA: Diagnosis not present

## 2021-12-01 NOTE — Progress Notes (Unsigned)
[place] Phone: [phone] Subjective:   [name], am serving as a scribe for Dr. Hulan Saas.  I'm seeing this patient by the request  of:  [place], Hal, MD  CC: Left fifth finger follow-up  [id number]  10/28/2021 Fifth finger DIP.  Patient has no extension at the moment.  Has been 3 weeks since the injury.  Discussed different treatment options and patient did elect to try a stack splint for 6 weeks.  Warned of avoiding any significant flexion without any of the bracing.  Follow-up again in 5 to 6 weeks to see if patient is doing and then will consider the possibility of occupational therapy if necessary.  Patient is in agreement with the plan and will follow-up at that time  Update 12/02/2021 Brooke [name] is a 81 y.o. female coming in with complaint of L hand pain. Patient states that she no longer has any pain. Has kept finger in splint since last visit.  Patient has been very careful and has not been taking 1 time      Past Medical History:  Diagnosis Date   Anxiety    Depression    no history of.   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   "Saturday night palsy"   Osteopenia    Rheumatic fever    no cardiology issues   Past Surgical History:  Procedure Laterality Date   CATARACT EXTRACTION Right    COLONOSCOPY WITH PROPOFOL N/A 01/08/2014   Procedure: COLONOSCOPY WITH PROPOFOL;  Surgeon: Garlan Fair, MD;  Location: [facility];  Service: Endoscopy;  Laterality: N/A;   PELVIC LAPAROSCOPY     [name]   TONSILLECTOMY     WRIST FRACTURE SURGERY Left    '11-hardware retained   Social History   Socioeconomic History   Marital status: Divorced    Spouse name: Not on file   Number of children: Not on file   Years of education: Not on file   Highest  education level: Not on file  Occupational History   Not on file  Tobacco Use   Smoking status: Never   Smokeless tobacco: Never  Substance and Sexual Activity   Alcohol use: Yes    Alcohol/week: 5.0 standard drinks of alcohol    Types: 5 Standard drinks or equivalent per week    Comment: 2 wine glasses per day.   Drug use: No   Sexual activity: Yes    Partners: Male    Comment: husband vasectomy  Other Topics Concern   Not on file  Social History Narrative   Not on file   Social Determinants of Health   Financial Resource Strain: Not on file  Food Insecurity: Not on file  Transportation Needs: Not on file  Physical Activity: Not on file  Stress: Not on file  Social Connections: Not on file   Allergies  Allergen Reactions   Latex     Long period ... Red skin    Family History  Problem Relation Age of Onset   Cancer Sister        ductal invasive cancer   Breast cancer Sister 77   Cancer Maternal Grandmother        colon   Cancer Father  lung (smoker)   Depression Sister    Osteoporosis Sister     Current Outpatient Medications (Endocrine & Metabolic):    denosumab (PROLIA) 60 MG/ML SOSY injection, Inject 60 mg into the skin every 6 (six) months.  Current Outpatient Medications (Cardiovascular):    rosuvastatin (CRESTOR) 5 MG tablet, Take 2 tablets (10 mg total) by mouth daily.  Current Outpatient Medications (Respiratory):    guaiFENesin (MUCINEX) 600 MG 12 hr tablet, Take 600 mg by mouth 2 (two) times daily as needed for cough.   loratadine (CLARITIN) 10 MG tablet, Take 10 mg by mouth daily as needed for allergies.  Current Outpatient Medications (Analgesics):    naproxen sodium (ALEVE) 220 MG tablet, Take 220 mg by mouth 2 (two) times daily as needed (for pain).   Current Outpatient Medications (Other):    ALPRAZolam (XANAX) 0.5 MG tablet, Take 0.5 mg by mouth daily as needed for sleep.    calcium-vitamin D (OSCAL WITH D) 500-200 MG-UNIT tablet,  Take 1 tablet by mouth 2 (two) times daily.   Cholecalciferol (VITAMIN D) 50 MCG (2000 UT) tablet, Take 2,000 Units by mouth daily.   clobetasol cream (TEMOVATE) 6.07 %, Apply 1 application topically 2 (two) times daily as needed (for rash).   diclofenac Sodium (VOLTAREN) 1 % GEL, Apply 2 g topically 4 (four) times daily as needed (for pain).   estradiol (ESTRACE) 0.1 MG/GM vaginal cream, Place 1 Applicatorful vaginally 2 (two) times a week.   Kelp 100 MG TABS, Take 100 mg by mouth daily.   melatonin 3 MG TABS tablet, Take 3 mg by mouth at bedtime.   Misc Natural Products (COSAMIN ASU ADVANCED FORMULA PO), Take 1 tablet by mouth daily.   Misc Natural Products (TART CHERRY ADVANCED PO), Take 1 tablet by mouth daily.   Turmeric 500 MG TABS, Take 500 mg by mouth daily.   Reviewed prior external information including notes and imaging from  primary care provider As well as notes that were available from care everywhere and other healthcare systems.  Past medical history, social, surgical and family history all reviewed in electronic medical record.  No pertanent information unless stated regarding to the chief complaint.   Review of Systems:  No headache, visual changes, nausea, vomiting, diarrhea, constipation, dizziness, abdominal pain, skin rash, fevers, chills, night sweats, weight loss, swollen lymph nodes, body aches, joint swelling, chest pain, shortness of breath, mood changes. POSITIVE muscle aches  Objective  Blood pressure 120/74, pulse (!) 102, height '5\' 5"'$  (1.651 m), weight 133 lb (60.3 kg), last menstrual period 03/08/1992, SpO2 97 %.   General: No apparent distress alert and oriented x3 mood and affect normal, dressed appropriately.  HEENT: Pupils equal, extraocular movements intact  Respiratory: Patient's speak in full sentences and does not appear short of breath  Cardiovascular: No lower extremity edema, non tender, no erythema  Hand exam on the left hand shows the patient  is wearing the stack splint.  We will remove the stack splint and patient does have some swelling between the DIP and PIP Patient no likely extensor mechanism of the distal to the DIP is intact.  Patient does have stiffness with flexion noted but neurovascular intact  Limited muscular skeletal ultrasound was performed and interpreted by Hulan Saas, M  Limited ultrasound of patient's extensor tendon does show that there is some callus formation noted.  This was seen on the coronal section. Patient does have fibers of the extensor tendon now intact distal to the DIP.  Patient does have what appears to be a small avulsion of the deep fibers.  That is proximal to the DIP. Impression: Interval improvement    Impression and Recommendations:     The above documentation has been reviewed and is accurate and complete Lyndal Pulley, DO

## 2021-12-02 ENCOUNTER — Ambulatory Visit: Payer: Self-pay

## 2021-12-02 ENCOUNTER — Encounter: Payer: Self-pay | Admitting: Family Medicine

## 2021-12-02 ENCOUNTER — Ambulatory Visit (INDEPENDENT_AMBULATORY_CARE_PROVIDER_SITE_OTHER): Payer: Medicare Other | Admitting: Family Medicine

## 2021-12-02 VITALS — BP 120/74 | HR 102 | Ht 65.0 in | Wt 133.0 lb

## 2021-12-02 DIAGNOSIS — S66812A Strain of other specified muscles, fascia and tendons at wrist and hand level, left hand, initial encounter: Secondary | ICD-10-CM | POA: Diagnosis not present

## 2021-12-02 DIAGNOSIS — M79642 Pain in left hand: Secondary | ICD-10-CM | POA: Diagnosis not present

## 2021-12-02 NOTE — Assessment & Plan Note (Signed)
Patient likely is healing very well.  We will keep her in the splint for 1 more week but then can start doing range of motion.  Given this a ball to help her with the hand therapy.  Could add physical therapy at her assisted living facility but I think patient will do well with the range of motion on her own.  Follow-up with me again 1 more time in 4 to 6 weeks to make sure completely resolved.

## 2021-12-02 NOTE — Patient Instructions (Signed)
Good to see you Wear splint for one more week Work on squeezing ball 20x a day Have appt for after [place]

## 2022-01-12 NOTE — Progress Notes (Signed)
[place] Phone: [phone] Subjective:   Brooke [name], am serving as a scribe for Dr. Hulan Saas.  I'm seeing this patient by the request  of:  [name], MD  CC: Finger pain follow-up  [id number]  12/02/2021 Patient likely is healing very well.  We will keep her in the splint for 1 more week but then can start doing range of motion.  Given this a ball to help her with the hand therapy.  Could add physical therapy at her assisted living facility but I think patient will do well with the range of motion on her own.  Follow-up with me again 1 more time in 4 to 6 weeks to make sure completely resolved.     Update 01/14/2022 Brooke [name] is a 81 y.o. female coming in with complaint of L hand pain. Patient states that her pain has subsided. Painful if she squeezes DIP. Continues to have extension of the DIP.       Past Medical History:  Diagnosis Date   Anxiety    Depression    [name] history of.   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   "Saturday night palsy"   Osteopenia    Rheumatic fever    [name] cardiology issues   Past Surgical History:  Procedure Laterality Date   CATARACT EXTRACTION Right    COLONOSCOPY WITH PROPOFOL N/A 01/08/2014   Procedure: COLONOSCOPY WITH PROPOFOL;  Surgeon: Garlan Fair, MD;  Location: [facility];  Service: Endoscopy;  Laterality: N/A;   PELVIC LAPAROSCOPY     [name]   TONSILLECTOMY     WRIST FRACTURE SURGERY Left    '11-hardware retained   Social History   Socioeconomic History   Marital status: Divorced    Spouse name: Not on file   Number of children: Not on file   Years of education: Not on file   Highest education level: Not on file  Occupational History   Not on file  Tobacco Use   Smoking status: Never   Smokeless  tobacco: Never  Substance and Sexual Activity   Alcohol use: Yes    Alcohol/week: 5.0 standard drinks of alcohol    Types: 5 Standard drinks or equivalent per week    Comment: 2 wine glasses per day.   Drug use: [name]   Sexual activity: Yes    Partners: Male    Comment: husband vasectomy  Other Topics Concern   Not on file  Social History Narrative   Not on file   Social Determinants of Health   Financial Resource Strain: Not on file  Food Insecurity: Not on file  Transportation Needs: Not on file  Physical Activity: Not on file  Stress: Not on file  Social Connections: Not on file   Allergies  Allergen Reactions   Latex     Long period ... Red skin    Family History  Problem Relation Age of Onset   Cancer Sister        ductal invasive cancer   Breast cancer Sister 55   Cancer Maternal Grandmother        colon   Cancer Father        lung (smoker)   Depression Sister    Osteoporosis Sister  Current Outpatient Medications (Endocrine & Metabolic):    denosumab (PROLIA) 60 MG/ML SOSY injection, Inject 60 mg into the skin every 6 (six) months.  Current Outpatient Medications (Cardiovascular):    rosuvastatin (CRESTOR) 5 MG tablet, Take 2 tablets (10 mg total) by mouth daily.  Current Outpatient Medications (Respiratory):    guaiFENesin (MUCINEX) 600 MG 12 hr tablet, Take 600 mg by mouth 2 (two) times daily as needed for cough.   loratadine (CLARITIN) 10 MG tablet, Take 10 mg by mouth daily as needed for allergies.  Current Outpatient Medications (Analgesics):    naproxen sodium (ALEVE) 220 MG tablet, Take 220 mg by mouth 2 (two) times daily as needed (for pain).   Current Outpatient Medications (Other):    ALPRAZolam (XANAX) 0.5 MG tablet, Take 0.5 mg by mouth daily as needed for sleep.    calcium-vitamin D (OSCAL WITH D) 500-200 MG-UNIT tablet, Take 1 tablet by mouth 2 (two) times daily.   Cholecalciferol (VITAMIN D) 50 MCG (2000 UT) tablet, Take 2,000 Units by  mouth daily.   clobetasol cream (TEMOVATE) 9.47 %, Apply 1 application topically 2 (two) times daily as needed (for rash).   diclofenac Sodium (VOLTAREN) 1 % GEL, Apply 2 g topically 4 (four) times daily as needed (for pain).   estradiol (ESTRACE) 0.1 MG/GM vaginal cream, Place 1 Applicatorful vaginally 2 (two) times a week.   Kelp 100 MG TABS, Take 100 mg by mouth daily.   melatonin 3 MG TABS tablet, Take 3 mg by mouth at bedtime.   Misc Natural Products (COSAMIN ASU ADVANCED FORMULA PO), Take 1 tablet by mouth daily.   Misc Natural Products (TART CHERRY ADVANCED PO), Take 1 tablet by mouth daily.   Turmeric 500 MG TABS, Take 500 mg by mouth daily.    Objective  Blood pressure 124/76, pulse 97, height '5\' 5"'$  (6.546 m), weight 136 lb (61.7 kg), last menstrual period 03/08/1992, SpO2 98 %.   General: [name] apparent distress alert and oriented x3 mood and affect normal, dressed appropriately.  HEENT: Pupils equal, extraocular movements intact  Respiratory: Patient's speak in full sentences and does not appear short of breath  Cardiovascular: [name] lower extremity edema, non tender, [name] erythema  Patient does still have a lag of the extensor of the DIP of the left fifth finger.  Lacks the last 10 degrees of extension.  Limited muscular skeletal ultrasound was performed and interpreted by Hulan Saas, M  Limited ultrasound shows that patient has small avulsion that was noted previously of the extensor tendon seems to be proximal to the DIP.  [name] increase in Doppler flow. Impression: Extensor tendon tear   Impression and Recommendations:     The above documentation has been reviewed and is accurate and complete Lyndal Pulley, DO

## 2022-01-13 ENCOUNTER — Other Ambulatory Visit: Payer: Self-pay | Admitting: Internal Medicine

## 2022-01-13 DIAGNOSIS — Z1231 Encounter for screening mammogram for malignant neoplasm of breast: Secondary | ICD-10-CM

## 2022-01-14 ENCOUNTER — Ambulatory Visit: Payer: Medicare Other | Admitting: Family Medicine

## 2022-01-14 ENCOUNTER — Ambulatory Visit: Payer: Self-pay

## 2022-01-14 ENCOUNTER — Encounter: Payer: Self-pay | Admitting: Family Medicine

## 2022-01-14 VITALS — BP 124/76 | HR 97 | Ht 65.0 in | Wt 136.0 lb

## 2022-01-14 DIAGNOSIS — M79642 Pain in left hand: Secondary | ICD-10-CM

## 2022-01-14 DIAGNOSIS — S66812A Strain of other specified muscles, fascia and tendons at wrist and hand level, left hand, initial encounter: Secondary | ICD-10-CM | POA: Diagnosis not present

## 2022-01-15 NOTE — Assessment & Plan Note (Signed)
Patient did have the tendon rupture.  Attempted 6 weeks of bracing with did not seem to make a significant difference.  Still better than what it was previously but unfortunately does not have full extension of the small finger.  We discussed different choices including surgical intervention or the possibility of PRP.  Patient at this point would like to just continue to monitor and see if she can live with it.  We will come back as needed otherwise.

## 2022-02-11 IMAGING — MG DIGITAL SCREENING BILAT W/ TOMO W/ CAD
6 of 10 series · 6 of 30 positions shown · non-contrast
Comparison: Previous exam(s).

CLINICAL DATA: Screening.

EXAM:
DIGITAL SCREENING BILATERAL MAMMOGRAM WITH TOMO AND CAD

[R CC synth-2D]
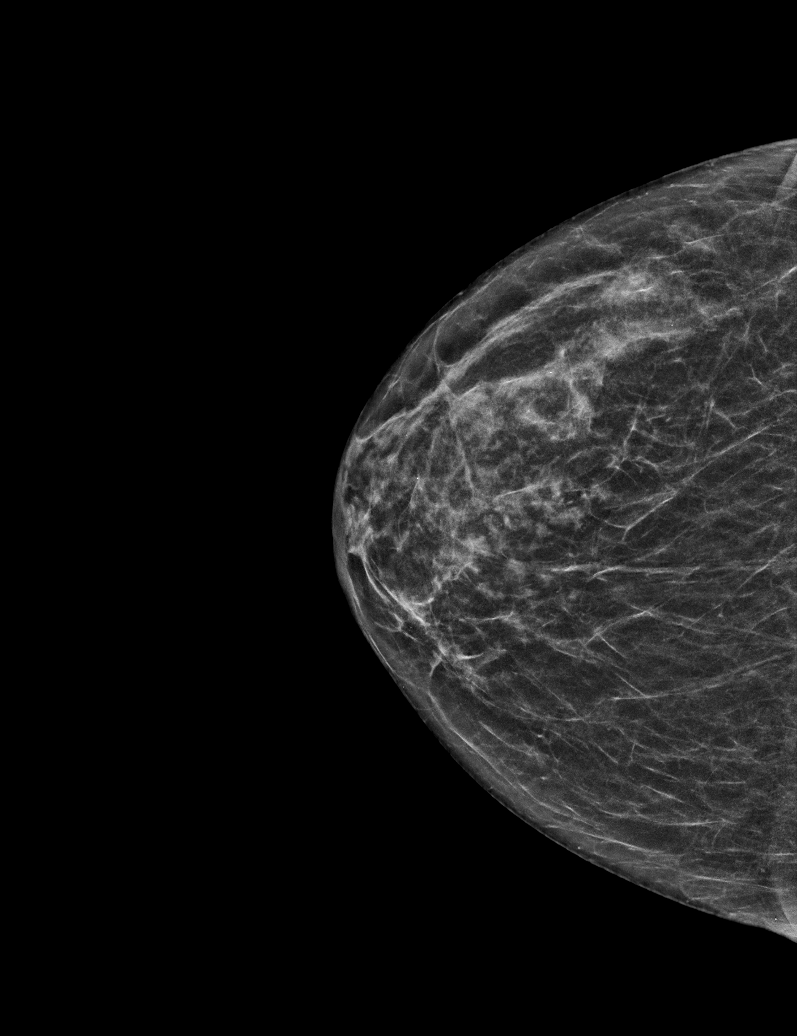

[L MLO synth-2D]
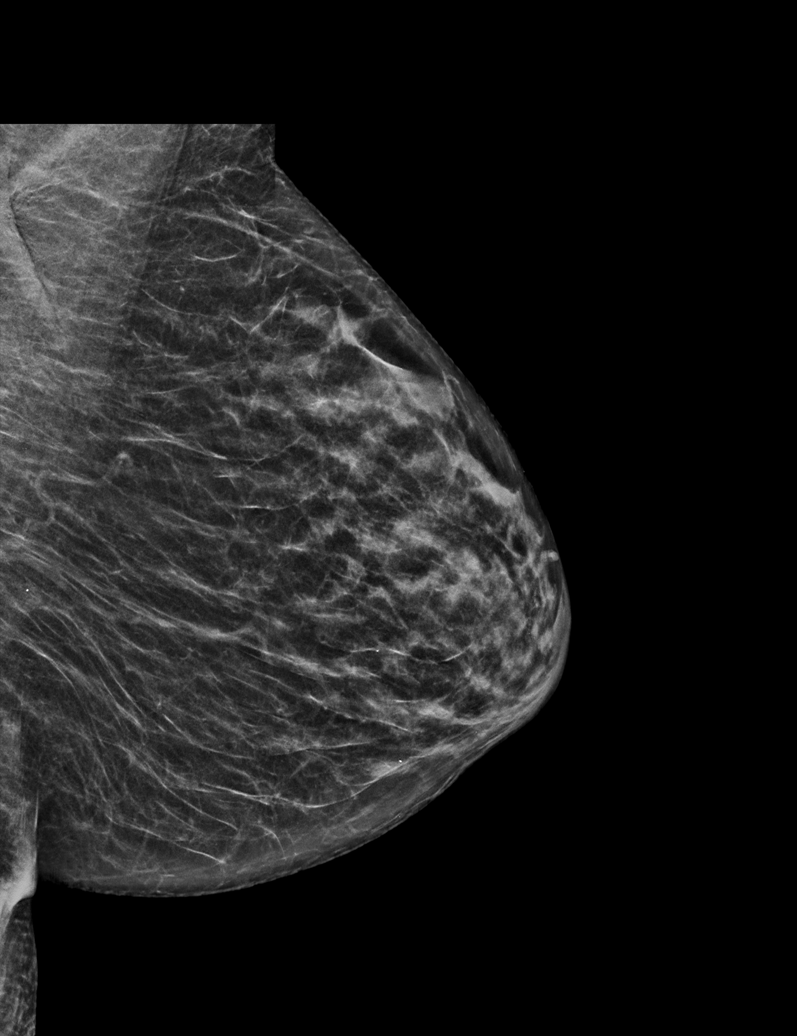

[R MLO synth-2D]
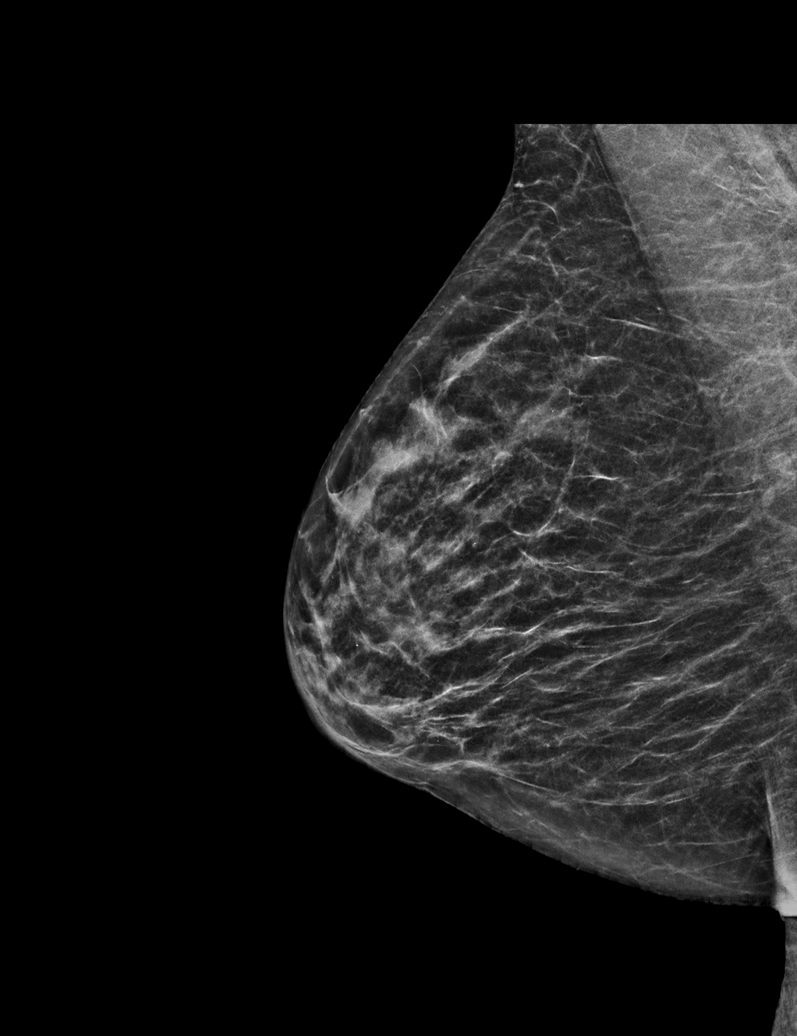

[L CC synth-2D (1 of 2)]
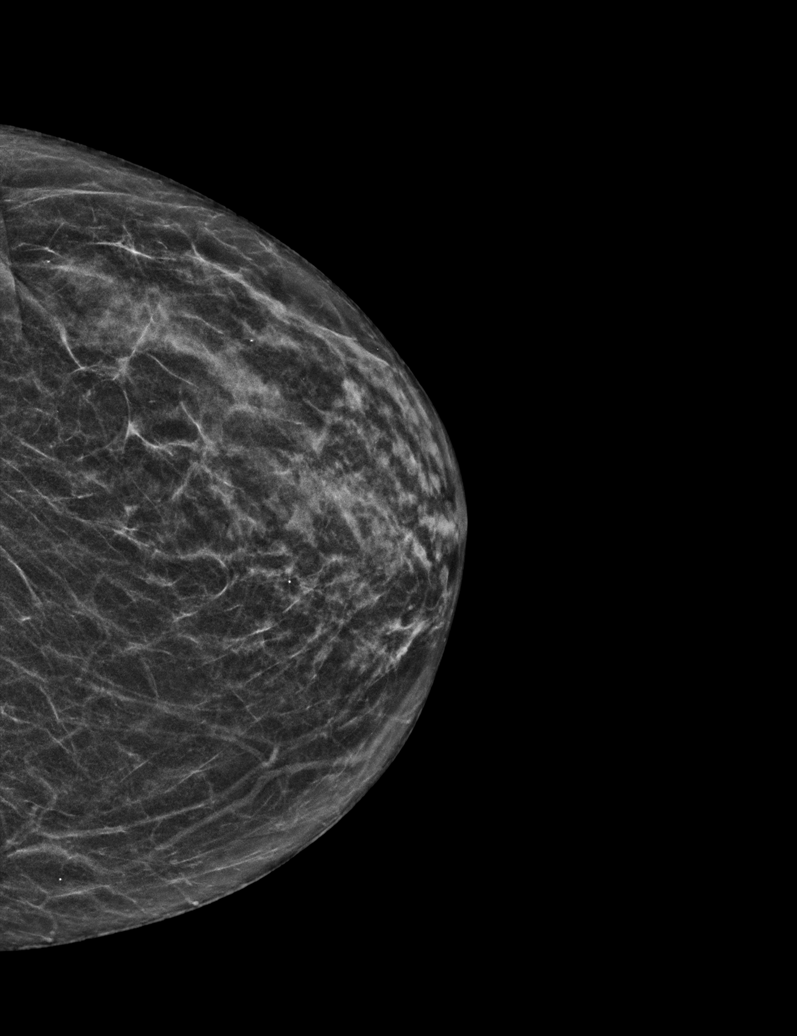

[L CC synth-2D (2 of 2)]
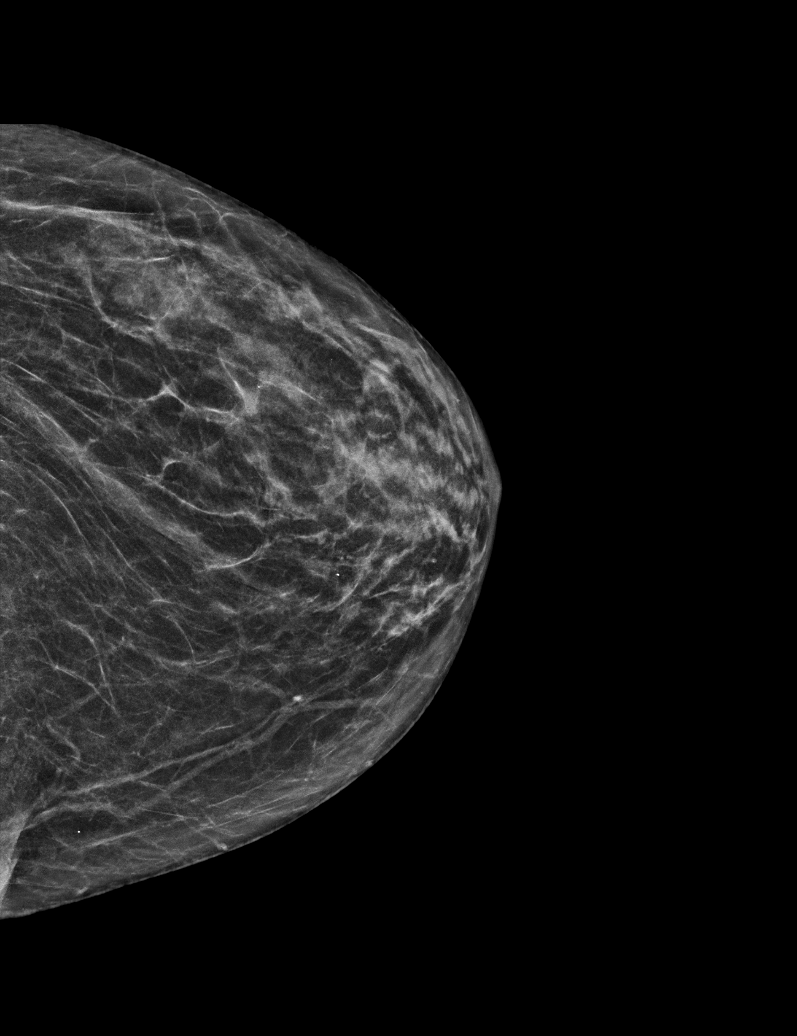

[L MLO tomo · tomo slice 24/47.0]
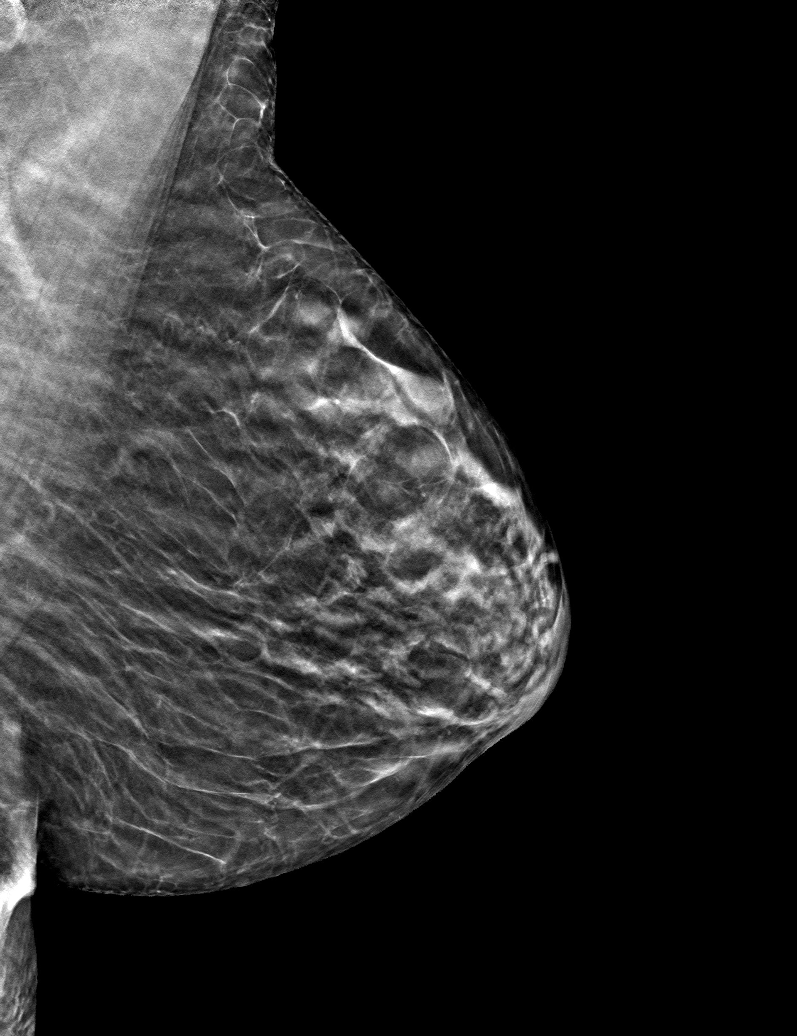

[6 of 30 positions shown; findings below may reference images not displayed]

ACR Breast Density Category c: The breast tissue is heterogeneously
dense, which may obscure small masses.
FINDINGS: There are no findings suspicious for malignancy. Images were
processed with CAD.
IMPRESSION: No mammographic evidence of malignancy. A result letter of this
screening mammogram will be mailed directly to the patient.

RECOMMENDATION:
Screening mammogram in one year. (Code:FT-[id number])

BI-RADS CATEGORY  1: Negative.

## 2022-02-19 ENCOUNTER — Encounter (INDEPENDENT_AMBULATORY_CARE_PROVIDER_SITE_OTHER): Payer: Medicare Other | Admitting: Ophthalmology

## 2022-02-23 ENCOUNTER — Encounter (INDEPENDENT_AMBULATORY_CARE_PROVIDER_SITE_OTHER): Payer: Medicare Other | Admitting: Ophthalmology

## 2022-02-23 DIAGNOSIS — H33303 Unspecified retinal break, bilateral: Secondary | ICD-10-CM | POA: Diagnosis not present

## 2022-02-23 DIAGNOSIS — H35371 Puckering of macula, right eye: Secondary | ICD-10-CM | POA: Diagnosis not present

## 2022-02-23 DIAGNOSIS — H43813 Vitreous degeneration, bilateral: Secondary | ICD-10-CM | POA: Diagnosis not present

## 2022-02-24 DIAGNOSIS — M79645 Pain in left finger(s): Secondary | ICD-10-CM | POA: Diagnosis not present

## 2022-02-25 ENCOUNTER — Ambulatory Visit: Payer: Self-pay

## 2022-02-25 ENCOUNTER — Ambulatory Visit: Payer: Medicare Other | Admitting: Family Medicine

## 2022-02-25 ENCOUNTER — Encounter: Payer: Self-pay | Admitting: Family Medicine

## 2022-02-25 VITALS — BP 140/80 | HR 103 | Ht 65.0 in | Wt 134.0 lb

## 2022-02-25 DIAGNOSIS — M79642 Pain in left hand: Secondary | ICD-10-CM

## 2022-02-25 DIAGNOSIS — S6992XA Unspecified injury of left wrist, hand and finger(s), initial encounter: Secondary | ICD-10-CM

## 2022-02-25 NOTE — Progress Notes (Signed)
Brooke [name] Phone: [phone] Subjective:   Brooke [name], am serving as a scribe for Dr. Hulan [name].  I'm seeing this patient by the request  of:  Brooke Ferretti, MD  CC: New finger injury  [id number]  01/14/2022 Patient did have the tendon rupture.  Attempted 6 weeks of bracing with did not seem to make a significant difference.  Still better than what it was previously but unfortunately does not have full extension of the small finger.  We discussed different choices including surgical intervention or the possibility of PRP.  Patient at this point would like to just continue to monitor and see if she can live with it.  We will come back as needed otherwise.     Update 02/25/2022 Brooke [name] is a 81 y.o. female coming in with complaint of L finger pain. Patient states that 2 nights ago she rolled over and had excruciating pain in L middle finger. Seen in UC. Pain from 3rd MCP to the PIP. Pain is improving. Finger is splinted.      Past Medical History:  Diagnosis Date   Anxiety    Depression    [name] history of.   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   "Saturday night palsy"   Osteopenia    Rheumatic fever    [name] cardiology issues   Past Surgical History:  Procedure Laterality Date   CATARACT EXTRACTION Right    COLONOSCOPY WITH PROPOFOL N/A 01/08/2014   Procedure: COLONOSCOPY WITH PROPOFOL;  Surgeon: Garlan Fair, MD;  Location: [facility];  Service: Endoscopy;  Laterality: N/A;   PELVIC LAPAROSCOPY     [name]   TONSILLECTOMY     WRIST FRACTURE SURGERY Left    '11-hardware retained   Social History   Socioeconomic History   Marital status: Divorced    Spouse name: Not on file   Number of children: Not on file   Years of education: Not on file   Highest  education level: Not on file  Occupational History   Not on file  Tobacco Use   Smoking status: Never   Smokeless tobacco: Never  Substance and Sexual Activity   Alcohol use: Yes    Alcohol/week: 5.0 standard drinks of alcohol    Types: 5 Standard drinks or equivalent per week    Comment: 2 wine glasses per day.   Drug use: [name]   Sexual activity: Yes    Partners: Male    Comment: husband vasectomy  Other Topics Concern   Not on file  Social History Narrative   Not on file   Social Determinants of Health   Financial Resource Strain: Not on file  Food Insecurity: Not on file  Transportation Needs: Not on file  Physical Activity: Not on file  Stress: Not on file  Social Connections: Not on file   Allergies  Allergen Reactions   Latex     Long period ... Red skin    Family History  Problem Relation Age of Onset   Cancer Sister        ductal invasive cancer   Breast cancer Sister 68   Cancer Maternal Grandmother        colon   Cancer Father  lung (smoker)   Depression Sister    Osteoporosis Sister     Current Outpatient Medications (Endocrine & Metabolic):    denosumab (PROLIA) 60 MG/ML SOSY injection, Inject 60 mg into the skin every 6 (six) months.  Current Outpatient Medications (Cardiovascular):    rosuvastatin (CRESTOR) 5 MG tablet, Take 2 tablets (10 mg total) by mouth daily.  Current Outpatient Medications (Respiratory):    guaiFENesin (MUCINEX) 600 MG 12 hr tablet, Take 600 mg by mouth 2 (two) times daily as needed for cough.   loratadine (CLARITIN) 10 MG tablet, Take 10 mg by mouth daily as needed for allergies.  Current Outpatient Medications (Analgesics):    naproxen sodium (ALEVE) 220 MG tablet, Take 220 mg by mouth 2 (two) times daily as needed (for pain).   Current Outpatient Medications (Other):    ALPRAZolam (XANAX) 0.5 MG tablet, Take 0.5 mg by mouth daily as needed for sleep.    calcium-vitamin D (OSCAL WITH D) 500-200 MG-UNIT tablet,  Take 1 tablet by mouth 2 (two) times daily.   Cholecalciferol (VITAMIN D) 50 MCG (2000 UT) tablet, Take 2,000 Units by mouth daily.   clobetasol cream (TEMOVATE) 4.09 %, Apply 1 application topically 2 (two) times daily as needed (for rash).   diclofenac Sodium (VOLTAREN) 1 % GEL, Apply 2 g topically 4 (four) times daily as needed (for pain).   estradiol (ESTRACE) 0.1 MG/GM vaginal cream, Place 1 Applicatorful vaginally 2 (two) times a week.   Kelp 100 MG TABS, Take 100 mg by mouth daily.   melatonin 3 MG TABS tablet, Take 3 mg by mouth at bedtime.   Misc Natural Products (COSAMIN ASU ADVANCED FORMULA PO), Take 1 tablet by mouth daily.   Misc Natural Products (TART CHERRY ADVANCED PO), Take 1 tablet by mouth daily.   Turmeric 500 MG TABS, Take 500 mg by mouth daily.   Reviewed prior external information including notes and imaging from  primary care provider As well as notes that were available from care everywhere and other healthcare systems.  Past medical history, social, surgical and family history all reviewed in electronic medical record.  [name] pertanent information unless stated regarding to the chief complaint.   Review of Systems:  [name] headache, visual changes, nausea, vomiting, diarrhea, constipation, dizziness, abdominal pain, skin rash, fevers, chills, night sweats, weight loss, swollen lymph nodes, body aches, joint swelling, chest pain, shortness of breath, mood changes. POSITIVE muscle aches  Objective  Blood pressure (!) 140/80, pulse (!) 103, height '5\' 5"'$  (1.651 m), weight 134 lb (60.8 kg), last menstrual period 03/08/1992, SpO2 98 %.   General: [name] apparent distress alert and oriented x3 mood and affect normal, dressed appropriately.  HEENT: Pupils equal, extraocular movements intact  Respiratory: Patient's speak in full sentences and does not appear short of breath  Cardiovascular: [name] lower extremity edema, non tender, [name] erythema  Patient's middle finger is in a splint.   Patient does have swelling noted between the PIP and the DIP.  Patient noted does when splint removed have relatively good range of motion.  Extensor mechanism is intact.  Tender to palpation though over the finger itself.  Good capillary refill noted.  Limited muscular skeletal ultrasound was performed and interpreted by Brooke [name], M  Patient does have hypoechoic changes of the soft tissue between the DIP to the PIP.  Very mild cortical irregularity that could be consistent with a nondisplaced fracture noted.  Extra-articular.  [name] significant swelling in the joints themselves.  What can  be seen in the extensor mechanism seems to be intact. Impression: Finger injury with possible nondisplaced fracture of the phalanx of the third finger    Impression and Recommendations:     The above documentation has been reviewed and is accurate and complete Lyndal Pulley, DO

## 2022-02-25 NOTE — Patient Instructions (Signed)
Frog splint  Try to wear day and night If better you can move it See me on the 5th

## 2022-02-27 DIAGNOSIS — S6992XA Unspecified injury of left wrist, hand and finger(s), initial encounter: Secondary | ICD-10-CM | POA: Insufficient documentation

## 2022-02-27 NOTE — Assessment & Plan Note (Signed)
Patient does have a finger injury noted.  Discussed with patient about splinting and steroid at this time.  Possible nondisplaced fracture noted but even if still should heal appropriately.  Discussed icing and immobilization for the next 2 to 3 weeks and patient will follow-up at that time we will further evaluate with the potential for ultrasound or even x-rays.

## 2022-03-16 ENCOUNTER — Ambulatory Visit: Payer: Medicare Other

## 2022-03-16 NOTE — Telephone Encounter (Signed)
Spoke to patient about this and gave information to enroll.

## 2022-03-19 ENCOUNTER — Ambulatory Visit: Payer: Medicare Other | Admitting: Family Medicine

## 2022-03-30 DIAGNOSIS — M810 Age-related osteoporosis without current pathological fracture: Secondary | ICD-10-CM | POA: Diagnosis not present

## 2022-03-30 DIAGNOSIS — I10 Essential (primary) hypertension: Secondary | ICD-10-CM | POA: Diagnosis not present

## 2022-04-05 ENCOUNTER — Ambulatory Visit
Admission: RE | Admit: 2022-04-05 | Discharge: 2022-04-05 | Disposition: A | Discharge status: Home / Self Care | Payer: Medicare Other | Source: Ambulatory Visit | Attending: Internal Medicine | Admitting: Internal Medicine

## 2022-04-05 DIAGNOSIS — Z1231 Encounter for screening mammogram for malignant neoplasm of breast: Secondary | ICD-10-CM | POA: Diagnosis not present

## 2022-06-24 DIAGNOSIS — L820 Inflamed seborrheic keratosis: Secondary | ICD-10-CM | POA: Diagnosis not present

## 2022-06-24 DIAGNOSIS — L821 Other seborrheic keratosis: Secondary | ICD-10-CM | POA: Diagnosis not present

## 2022-06-24 DIAGNOSIS — D1801 Hemangioma of skin and subcutaneous tissue: Secondary | ICD-10-CM | POA: Diagnosis not present

## 2022-06-24 DIAGNOSIS — D3617 Benign neoplasm of peripheral nerves and autonomic nervous system of trunk, unspecified: Secondary | ICD-10-CM | POA: Diagnosis not present

## 2022-06-24 DIAGNOSIS — D2261 Melanocytic nevi of right upper limb, including shoulder: Secondary | ICD-10-CM | POA: Diagnosis not present

## 2022-06-24 DIAGNOSIS — D225 Melanocytic nevi of trunk: Secondary | ICD-10-CM | POA: Diagnosis not present

## 2022-06-24 DIAGNOSIS — L218 Other seborrheic dermatitis: Secondary | ICD-10-CM | POA: Diagnosis not present

## 2022-06-24 DIAGNOSIS — D2262 Melanocytic nevi of left upper limb, including shoulder: Secondary | ICD-10-CM | POA: Diagnosis not present

## 2022-07-21 NOTE — Progress Notes (Unsigned)
[name] [address] [place] [address] Phone: [phone] Subjective:   Brooke [name], am serving as a scribe for Dr. Antoine [name].  I'm seeing this patient by the request  of:  Brooke Ates, MD  CC: right shoulder pain   [id number]  02/25/2022 Patient does have a finger injury noted.  Discussed with patient about splinting and steroid at this time.  Possible nondisplaced fracture noted but even if still should heal appropriately.  Discussed icing and immobilization for the next 2 to 3 weeks and patient will follow-up at that time we will further evaluate with the potential for ultrasound or even x-rays.   Updated 07/22/2022 Brooke [name] is a 82 y.o. female coming in with complaint of R shoulder pain. Patient states that she works out at [employer]. Ice and voltaren gel were alleviating her pain. Pain over anterior and superior shoulder with flexion and abduction. Has been modifying yoga poses.        Past Medical History:  Diagnosis Date   Anxiety    Depression    no history of.   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   "Saturday night palsy"   Osteopenia    Rheumatic fever    no cardiology issues   Past Surgical History:  Procedure Laterality Date   CATARACT EXTRACTION Right    COLONOSCOPY WITH PROPOFOL N/A 01/08/2014   Procedure: COLONOSCOPY WITH PROPOFOL;  Surgeon: Brooke Bumpers, MD;  Location: [facility];  Service: Endoscopy;  Laterality: N/A;   PELVIC LAPAROSCOPY     [name]   TONSILLECTOMY     WRIST FRACTURE SURGERY Left    '11-hardware retained   Social History   Socioeconomic History   Marital status: Divorced    Spouse name: Not on file   Number of children: Not on file   Years of education: Not on file   Highest education level: Not on file  Occupational History   Not on  file  Tobacco Use   Smoking status: Never   Smokeless tobacco: Never  Substance and Sexual Activity   Alcohol use: Yes    Alcohol/week: 5.0 standard drinks of alcohol    Types: 5 Standard drinks or equivalent per week    Comment: 2 wine glasses per day.   Drug use: No   Sexual activity: Yes    Partners: Male    Comment: husband vasectomy  Other Topics Concern   Not on file  Social History Narrative   Not on file   Social Determinants of Health   Financial Resource Strain: Not on file  Food Insecurity: Not on file  Transportation Needs: Not on file  Physical Activity: Not on file  Stress: Not on file  Social Connections: Not on file   Allergies  Allergen Reactions   Latex     Long period ... Red skin    Family History  Problem Relation Age of Onset   Cancer Sister        ductal invasive cancer   Breast cancer Sister 58   Cancer Maternal Grandmother        colon   Cancer Father        lung (smoker)   Depression Sister    Osteoporosis Sister     Current Outpatient Medications (Endocrine & Metabolic):  denosumab (PROLIA) 60 MG/ML SOSY injection, Inject 60 mg into the skin every 6 (six) months.  Current Outpatient Medications (Cardiovascular):    rosuvastatin (CRESTOR) 5 MG tablet, Take 2 tablets (10 mg total) by mouth daily.  Current Outpatient Medications (Respiratory):    guaiFENesin (MUCINEX) 600 MG 12 hr tablet, Take 600 mg by mouth 2 (two) times daily as needed for cough.   loratadine (CLARITIN) 10 MG tablet, Take 10 mg by mouth daily as needed for allergies.  Current Outpatient Medications (Analgesics):    naproxen sodium (ALEVE) 220 MG tablet, Take 220 mg by mouth 2 (two) times daily as needed (for pain).   Current Outpatient Medications (Other):    ALPRAZolam (XANAX) 0.5 MG tablet, Take 0.5 mg by mouth daily as needed for sleep.    calcium-vitamin D (OSCAL WITH D) 500-200 MG-UNIT tablet, Take 1 tablet by mouth 2 (two) times daily.   Cholecalciferol  (VITAMIN D) 50 MCG (2000 UT) tablet, Take 2,000 Units by mouth daily.   clobetasol cream (TEMOVATE) 0.05 %, Apply 1 application topically 2 (two) times daily as needed (for rash).   diclofenac Sodium (VOLTAREN) 1 % GEL, Apply 2 g topically 4 (four) times daily as needed (for pain).   estradiol (ESTRACE) 0.1 MG/GM vaginal cream, Place 1 Applicatorful vaginally 2 (two) times a week.   Kelp 100 MG TABS, Take 100 mg by mouth daily.   melatonin 3 MG TABS tablet, Take 3 mg by mouth at bedtime.   Misc Natural Products (COSAMIN ASU ADVANCED FORMULA PO), Take 1 tablet by mouth daily.   Misc Natural Products (TART CHERRY ADVANCED PO), Take 1 tablet by mouth daily.   Turmeric 500 MG TABS, Take 500 mg by mouth daily.   Reviewed prior external information including notes and imaging from  primary care provider As well as notes that were available from care everywhere and other healthcare systems.  Past medical history, social, surgical and family history all reviewed in electronic medical record.  No pertanent information unless stated regarding to the chief complaint.   Review of Systems:  No headache, visual changes, nausea, vomiting, diarrhea, constipation, dizziness, abdominal pain, skin rash, fevers, chills, night sweats, weight loss, swollen lymph nodes, body aches, joint swelling, chest pain, shortness of breath, mood changes. POSITIVE muscle aches  Objective  Blood pressure 118/72, pulse 78, height 5\' 5"  (1.651 m), weight 140 lb (63.5 kg), last menstrual period 03/08/1992, SpO2 98 %.   General: No apparent distress alert and oriented x3 mood and affect normal, dressed appropriately.  HEENT: Pupils equal, extraocular movements intact  Respiratory: Patient's speak in full sentences and does not appear short of breath  Cardiovascular: No lower extremity edema, non tender, no erythema  Right shoulder exam shows patient does have positive impingement noted.  Worsening pain with crossover test noted  as well.  Mild discomfort with O'Brien but no weakness.  Mild weakness with external rotation on the right compared to the left  Shoulder: Right Inspection reveals no abnormalities, atrophy or asymmetry. Palpation is normal with no tenderness over AC joint or bicipital groove. ROM is full in all planes passively. Rotator cuff strength normal throughout. signs of impingement with positive Neer and Hawkin's tests, but negative empty can sign. Speeds and Yergason's tests normal. No labral pathology noted with negative Obrien's, negative clunk and good stability. Normal scapular function observed. No painful arc and no drop arm sign. No apprehension sign  [facility] US performed of: Right This study was ordered, performed, and interpreted  by Terrilee Files D.O.  Shoulder:   Supraspinatus:  Appears normal on long and transverse views, Bursal bulge seen with shoulder abduction on impingement view.  Degenerative changes noted Subscapularis:  Appears normal on long and transverse views. Positive bursa degenerative changes noted AC joint: Significant arthritis with hypoechoic changes Biceps Tendon:  Appears normal on long and transverse views, no fraying of tendon, tendon located in intertubercular groove, no subluxation with shoulder internal or external rotation.  Impression: Subacromial bursitis, degenerative rotator cuff findings and acromioclavicular arthritis  Procedure: Real-time Ultrasound Guided Injection of right glenohumeral joint Device: GE Logiq E  Ultrasound guided injection is preferred based studies that show increased duration, increased effect, greater accuracy, decreased procedural pain, increased response rate with ultrasound guided versus blind injection.  Verbal informed consent obtained.  Time-out conducted.  Noted no overlying erythema, induration, or other signs of local infection.  Skin prepped in a sterile fashion.  Local anesthesia: Topical Ethyl chloride.  With sterile  technique and under real time ultrasound guidance:  Joint visualized.  23g 1  inch needle inserted posterior approach. Pictures taken for needle placement. Patient did have injection of  2 cc of 0.5% Marcaine, and 1.0 cc of Kenalog 40 mg/dL. Completed without difficulty  Pain immediately resolved suggesting accurate placement of the medication.  Advised to call if fevers/chills, erythema, induration, drainage, or persistent bleeding. .  Impression: Technically successful ultrasound guided injection.  Procedure: Real-time Ultrasound Guided Injection of right acromioclavicular joint Device: GE Logiq Q7 Ultrasound guided injection is preferred based studies that show increased duration, increased effect, greater accuracy, decreased procedural pain, increased response rate, and decreased cost with ultrasound guided versus blind injection.  Verbal informed consent obtained.  Time-out conducted.  Noted no overlying erythema, induration, or other signs of local infection.  Skin prepped in a sterile fashion.  Local anesthesia: Topical Ethyl chloride.  With sterile technique and under real time ultrasound guidance: With a 25-gauge half inch needle injected with 0.5 cc of 0.5% Marcaine and 0.5 cc of Kenalog 40 mg/mL Completed without difficulty  Pain immediately resolved suggesting accurate placement of the medication.  Advised to call if fevers/chills, erythema, induration, drainage, or persistent bleeding.  Impression: Technically successful ultrasound guided injection.    Impression and Recommendations:    The above documentation has been reviewed and is accurate and complete Judi Saa, DO

## 2022-07-22 ENCOUNTER — Ambulatory Visit: Payer: Medicare Other | Admitting: Family Medicine

## 2022-07-22 ENCOUNTER — Other Ambulatory Visit: Payer: Self-pay

## 2022-07-22 ENCOUNTER — Encounter: Payer: Self-pay | Admitting: Family Medicine

## 2022-07-22 VITALS — BP 118/72 | HR 78 | Ht 65.0 in | Wt 140.0 lb

## 2022-07-22 DIAGNOSIS — M25511 Pain in right shoulder: Secondary | ICD-10-CM

## 2022-07-22 DIAGNOSIS — M19011 Primary osteoarthritis, right shoulder: Secondary | ICD-10-CM | POA: Diagnosis not present

## 2022-07-22 NOTE — Assessment & Plan Note (Signed)
Patient given injection today, moderate to severe arthritis noted.  Discussed icing regimen and home exercises, discussed which activities to do and which ones to avoid.  Discussed range of motion exercises.  Follow-up again in 6 to 8 weeks

## 2022-07-22 NOTE — Patient Instructions (Signed)
See me again in 2-3 months Exercises Injected shoulder in 2 spots today

## 2022-07-22 NOTE — Assessment & Plan Note (Signed)
Patient given injection today and tolerated the procedure well, discussed icing regimen and home exercises, discussed which activities to do and which ones to avoid.  Patient will work out still at [employer] but keep hands within peripheral vision.  Do feel that there is some underlying arthritis we will need to monitor.  Follow-up with me again in 6 to 8 weeks

## 2022-08-11 ENCOUNTER — Telehealth: Payer: Self-pay | Admitting: Family Medicine

## 2022-08-11 NOTE — Telephone Encounter (Signed)
Sent patient MyChart message.

## 2022-08-11 NOTE — Telephone Encounter (Signed)
Patient called stating that she was given some exercises to do and was told to do them 3 times a week. She asked how long she should continue to do them? She also mentioned that she will be out of town for 2 weeks and asked if she needed to take the bands with her to do it while she was gone?  Please advise.

## 2022-10-12 DIAGNOSIS — Z9189 Other specified personal risk factors, not elsewhere classified: Secondary | ICD-10-CM | POA: Diagnosis not present

## 2022-10-14 DIAGNOSIS — E7800 Pure hypercholesterolemia, unspecified: Secondary | ICD-10-CM | POA: Diagnosis not present

## 2022-10-14 DIAGNOSIS — M810 Age-related osteoporosis without current pathological fracture: Secondary | ICD-10-CM | POA: Diagnosis not present

## 2022-10-14 DIAGNOSIS — Z0000 Encounter for general adult medical examination without abnormal findings: Secondary | ICD-10-CM | POA: Diagnosis not present

## 2022-10-14 DIAGNOSIS — I10 Essential (primary) hypertension: Secondary | ICD-10-CM | POA: Diagnosis not present

## 2022-10-14 DIAGNOSIS — Z79899 Other long term (current) drug therapy: Secondary | ICD-10-CM | POA: Diagnosis not present

## 2022-10-27 DIAGNOSIS — E349 Endocrine disorder, unspecified: Secondary | ICD-10-CM | POA: Diagnosis not present

## 2022-10-27 DIAGNOSIS — M8588 Other specified disorders of bone density and structure, other site: Secondary | ICD-10-CM | POA: Diagnosis not present

## 2022-11-25 NOTE — Progress Notes (Signed)
[name] [address] [place] [address] Phone: [phone] Subjective:   [name], am serving as a scribe for Dr. Antoine Primas.  I'm seeing this patient by the request  of:  Thana Ates, MD  CC: Right shoulder pain  [id number]  07/22/2022 Patient given injection today, moderate to severe arthritis noted.  Discussed icing regimen and home exercises, discussed which activities to do and which ones to avoid.  Discussed range of motion exercises.  Follow-up again in 6 to 8 weeks      Update 11/29/2022 Brooke [name] is a 82 y.o. female coming in with complaint of R shoulder pain. Patient states injection worked well. Started hurting again about a month ago. Think it's the same old pain.      Past Medical History:  Diagnosis Date   Anxiety    Depression    no history of.   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   "Saturday night palsy"   Osteopenia    Rheumatic fever    no cardiology issues   Past Surgical History:  Procedure Laterality Date   CATARACT EXTRACTION Right    COLONOSCOPY WITH PROPOFOL N/A 01/08/2014   Procedure: COLONOSCOPY WITH PROPOFOL;  Surgeon: Charolett Bumpers, MD;  Location: [facility];  Service: Endoscopy;  Laterality: N/A;   PELVIC LAPAROSCOPY     [name]   TONSILLECTOMY     WRIST FRACTURE SURGERY Left    '11-hardware retained   Social History   Socioeconomic History   Marital status: Divorced    Spouse name: Not on file   Number of children: Not on file   Years of education: Not on file   Highest education level: Not on file  Occupational History   Not on file  Tobacco Use   Smoking status: Never   Smokeless tobacco: Never  Substance and Sexual Activity   Alcohol use: Yes    Alcohol/week: 5.0 standard drinks of alcohol    Types: 5 Standard drinks or equivalent  per week    Comment: 2 wine glasses per day.   Drug use: No   Sexual activity: Yes    Partners: Male    Comment: husband vasectomy  Other Topics Concern   Not on file  Social History Narrative   Not on file   Social Determinants of Health   Financial Resource Strain: Not on file  Food Insecurity: Not on file  Transportation Needs: Not on file  Physical Activity: Not on file  Stress: Not on file  Social Connections: Not on file   Allergies  Allergen Reactions   Latex     Long period ... Red skin    Family History  Problem Relation Age of Onset   Cancer Sister        ductal invasive cancer   Breast cancer Sister 26   Cancer Maternal Grandmother        colon   Cancer Father        lung (smoker)   Depression Sister    Osteoporosis Sister     Current Outpatient Medications (Endocrine & Metabolic):    denosumab (PROLIA) 60 MG/ML SOSY injection, Inject 60 mg into the skin every 6 (six) months.  Current Outpatient Medications (Cardiovascular):    rosuvastatin (CRESTOR) 5 MG tablet, Take 2  tablets (10 mg total) by mouth daily.  Current Outpatient Medications (Respiratory):    guaiFENesin (MUCINEX) 600 MG 12 hr tablet, Take 600 mg by mouth 2 (two) times daily as needed for cough.   loratadine (CLARITIN) 10 MG tablet, Take 10 mg by mouth daily as needed for allergies.  Current Outpatient Medications (Analgesics):    naproxen sodium (ALEVE) 220 MG tablet, Take 220 mg by mouth 2 (two) times daily as needed (for pain).   Current Outpatient Medications (Other):    ALPRAZolam (XANAX) 0.5 MG tablet, Take 0.5 mg by mouth daily as needed for sleep.    calcium-vitamin D (OSCAL WITH D) 500-200 MG-UNIT tablet, Take 1 tablet by mouth 2 (two) times daily.   Cholecalciferol (VITAMIN D) 50 MCG (2000 UT) tablet, Take 2,000 Units by mouth daily.   clobetasol cream (TEMOVATE) 0.05 %, Apply 1 application topically 2 (two) times daily as needed (for rash).   diclofenac Sodium (VOLTAREN) 1 %  GEL, Apply 2 g topically 4 (four) times daily as needed (for pain).   estradiol (ESTRACE) 0.1 MG/GM vaginal cream, Place 1 Applicatorful vaginally 2 (two) times a week.   Kelp 100 MG TABS, Take 100 mg by mouth daily.   melatonin 3 MG TABS tablet, Take 3 mg by mouth at bedtime.   Misc Natural Products (COSAMIN ASU ADVANCED FORMULA PO), Take 1 tablet by mouth daily.   Misc Natural Products (TART CHERRY ADVANCED PO), Take 1 tablet by mouth daily.   Turmeric 500 MG TABS, Take 500 mg by mouth daily.   Reviewed prior external information including notes and imaging from  primary care provider As well as notes that were available from care everywhere and other healthcare systems.  Past medical history, social, surgical and family history all reviewed in electronic medical record.  No pertanent information unless stated regarding to the chief complaint.   Review of Systems:  No headache, visual changes, nausea, vomiting, diarrhea, constipation, dizziness, abdominal pain, skin rash, fevers, chills, night sweats, weight loss, swollen lymph nodes, body aches, joint swelling, chest pain, shortness of breath, mood changes. POSITIVE muscle aches  Objective  Blood pressure 120/68, pulse 96, height 5\' 5"  (1.651 m), last menstrual period 03/08/1992, SpO2 97%.   General: No apparent distress alert and oriented x3 mood and affect normal, dressed appropriately.  HEENT: Pupils equal, extraocular movements intact  Respiratory: Patient's speak in full sentences and does not appear short of breath  Cardiovascular: No lower extremity edema, non tender, no erythema  Right shoulder exam shows the patient does have positive impingement noted.  Patient does have a positive crossover noted.  Tenderness to palpation in the paraspinal musculature.  Procedure: Real-time Ultrasound Guided Injection of right glenohumeral joint Device: GE Logiq Q7  Ultrasound guided injection is preferred based studies that show increased  duration, increased effect, greater accuracy, decreased procedural pain, increased response rate with ultrasound guided versus blind injection.  Verbal informed consent obtained.  Time-out conducted.  Noted no overlying erythema, induration, or other signs of local infection.  Skin prepped in a sterile fashion.  Local anesthesia: Topical Ethyl chloride.  With sterile technique and under real time ultrasound guidance:  Joint visualized.  23g 1  inch needle inserted posterior approach. Pictures taken for needle placement. Patient did have injection of  2 cc of 0.5% Marcaine, and 1.0 cc of Kenalog 40 mg/dL. Completed without difficulty  Advised to call if fevers/chills, erythema, induration, drainage, or persistent bleeding.  Impression: Technically successful ultrasound guided injection.  Procedure: Real-time Ultrasound Guided Injection of right acromioclavicular joint Device: GE Logiq Q7 Ultrasound guided injection is preferred based studies that show increased duration, increased effect, greater accuracy, decreased procedural pain, increased response rate, and decreased cost with ultrasound guided versus blind injection.  Verbal informed consent obtained.  Time-out conducted.  Noted no overlying erythema, induration, or other signs of local infection.  Skin prepped in a sterile fashion.  Local anesthesia: Topical Ethyl chloride.  With sterile technique and under real time ultrasound guidance: With a 25-gauge half inch needle injected with 0.5 cc of 0.5% Marcaine and 0.5 cc of Kenalog 40 mg/mL Completed without difficulty  Pain immediately resolved suggesting accurate placement of the medication.  Advised to call if fevers/chills, erythema, induration, drainage, or persistent bleeding.  Impression: Technically successful ultrasound guided injection.    Impression and Recommendations:     The above documentation has been reviewed and is accurate and complete Judi Saa, DO

## 2022-11-29 ENCOUNTER — Other Ambulatory Visit: Payer: Self-pay

## 2022-11-29 ENCOUNTER — Ambulatory Visit: Payer: Medicare Other | Admitting: Family Medicine

## 2022-11-29 ENCOUNTER — Encounter: Payer: Self-pay | Admitting: Family Medicine

## 2022-11-29 ENCOUNTER — Ambulatory Visit (INDEPENDENT_AMBULATORY_CARE_PROVIDER_SITE_OTHER): Payer: Medicare Other

## 2022-11-29 VITALS — BP 120/68 | HR 96 | Ht 65.0 in

## 2022-11-29 DIAGNOSIS — M25511 Pain in right shoulder: Secondary | ICD-10-CM | POA: Diagnosis not present

## 2022-11-29 DIAGNOSIS — M85811 Other specified disorders of bone density and structure, right shoulder: Secondary | ICD-10-CM | POA: Diagnosis not present

## 2022-11-29 DIAGNOSIS — M19011 Primary osteoarthritis, right shoulder: Secondary | ICD-10-CM

## 2022-11-29 MED ORDER — METHYLPREDNISOLONE ACETATE 80 MG/ML IJ SUSP
80.0000 mg | Freq: Once | INTRAMUSCULAR | Status: AC
  Administered 2022-11-29: 60 mg via INTRA_ARTICULAR

## 2022-11-29 NOTE — Patient Instructions (Addendum)
Injections in shoulder today Xray today Good to see you! See you again in 3 months

## 2022-11-29 NOTE — Assessment & Plan Note (Signed)
Patient given injection and tolerated the procedure well, discussed icing regimen and home exercises, discussed avoiding certain activities.  Increase activity slowly.  Follow-up again in 6 to 8 weeks

## 2022-11-29 NOTE — Assessment & Plan Note (Signed)
Repeat injection given today and tolerated the procedure well.  Will get x-rays to further evaluate the bony abnormalities that could be contributing.  Discussed icing regimen and home exercises, discussed which activities to do and which ones to avoid.  Increase activity slowly otherwise.  Follow-up again in 6 to 8 weeks.

## 2023-02-16 NOTE — Progress Notes (Signed)
[name] [address] [place] [address] Phone: [phone] Subjective:   Brooke [name], am serving as a scribe for Dr. Antoine Primas.  I'm seeing this patient by the request  of:  Thana Ates, MD  CC: Right shoulder pain  [id number]  11/29/2022 Repeat injection given today and tolerated the procedure well.  Will get x-rays to further evaluate the bony abnormalities that could be contributing.  Discussed icing regimen and home exercises, discussed which activities to do and which ones to avoid.  Increase activity slowly otherwise.  Follow-up again in 6 to 8 weeks.     Patient given injection and tolerated the procedure well, discussed icing regimen and home exercises, discussed avoiding certain activities.  Increase activity slowly.  Follow-up again in 6 to 8 weeks.     Updated 02/18/2023 Brooke [name] is a 82 y.o. female coming in with complaint of R shoulder pain. Shoulder starting hurting more than before about 1 month ago. No MOI.       Past Medical History:  Diagnosis Date   Anxiety    Depression    no history of.   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   "Saturday night palsy"   Osteopenia    Rheumatic fever    no cardiology issues   Past Surgical History:  Procedure Laterality Date   CATARACT EXTRACTION Right    COLONOSCOPY WITH PROPOFOL N/A 01/08/2014   Procedure: COLONOSCOPY WITH PROPOFOL;  Surgeon: Charolett Bumpers, MD;  Location: [facility];  Service: Endoscopy;  Laterality: N/A;   PELVIC LAPAROSCOPY     [name]   TONSILLECTOMY     WRIST FRACTURE SURGERY Left    '11-hardware retained   Social History   Socioeconomic History   Marital status: Divorced    Spouse name: Not on file   Number of children: Not on file   Years of education: Not on file   Highest education level: Not  on file  Occupational History   Not on file  Tobacco Use   Smoking status: Never   Smokeless tobacco: Never  Substance and Sexual Activity   Alcohol use: Yes    Alcohol/week: 5.0 standard drinks of alcohol    Types: 5 Standard drinks or equivalent per week    Comment: 2 wine glasses per day.   Drug use: No   Sexual activity: Yes    Partners: Male    Comment: husband vasectomy  Other Topics Concern   Not on file  Social History Narrative   Not on file   Social Drivers of Health   Financial Resource Strain: Not on file  Food Insecurity: Not on file  Transportation Needs: Not on file  Physical Activity: Not on file  Stress: Not on file  Social Connections: Not on file   Allergies  Allergen Reactions   Latex     Long period ... Red skin    Family History  Problem Relation Age of Onset   Cancer Sister        ductal invasive cancer   Breast cancer Sister 69   Cancer Maternal Grandmother        colon   Cancer Father        lung (smoker)   Depression Sister    Osteoporosis Sister  Current Outpatient Medications (Endocrine & Metabolic):    denosumab (PROLIA) 60 MG/ML SOSY injection, Inject 60 mg into the skin every 6 (six) months.  Current Outpatient Medications (Cardiovascular):    rosuvastatin (CRESTOR) 5 MG tablet, Take 2 tablets (10 mg total) by mouth daily.  Current Outpatient Medications (Respiratory):    guaiFENesin (MUCINEX) 600 MG 12 hr tablet, Take 600 mg by mouth 2 (two) times daily as needed for cough.   loratadine (CLARITIN) 10 MG tablet, Take 10 mg by mouth daily as needed for allergies.  Current Outpatient Medications (Analgesics):    naproxen sodium (ALEVE) 220 MG tablet, Take 220 mg by mouth 2 (two) times daily as needed (for pain).   Current Outpatient Medications (Other):    ALPRAZolam (XANAX) 0.5 MG tablet, Take 0.5 mg by mouth daily as needed for sleep.    calcium-vitamin D (OSCAL WITH D) 500-200 MG-UNIT tablet, Take 1 tablet by mouth 2  (two) times daily.   Cholecalciferol (VITAMIN D) 50 MCG (2000 UT) tablet, Take 2,000 Units by mouth daily.   clobetasol cream (TEMOVATE) 0.05 %, Apply 1 application topically 2 (two) times daily as needed (for rash).   diclofenac Sodium (VOLTAREN) 1 % GEL, Apply 2 g topically 4 (four) times daily as needed (for pain).   estradiol (ESTRACE) 0.1 MG/GM vaginal cream, Place 1 Applicatorful vaginally 2 (two) times a week.   Kelp 100 MG TABS, Take 100 mg by mouth daily.   melatonin 3 MG TABS tablet, Take 3 mg by mouth at bedtime.   Misc Natural Products (COSAMIN ASU ADVANCED FORMULA PO), Take 1 tablet by mouth daily.   Misc Natural Products (TART CHERRY ADVANCED PO), Take 1 tablet by mouth daily.   Turmeric 500 MG TABS, Take 500 mg by mouth daily.   Reviewed prior external information including notes and imaging from  primary care provider As well as notes that were available from care everywhere and other healthcare systems.  Past medical history, social, surgical and family history all reviewed in electronic medical record.  No pertanent information unless stated regarding to the chief complaint.   Review of Systems:  No headache, visual changes, nausea, vomiting, diarrhea, constipation, dizziness, abdominal pain, skin rash, fevers, chills, night sweats, weight loss, swollen lymph nodes, body aches, joint swelling, chest pain, shortness of breath, mood changes. POSITIVE muscle aches  Objective  Blood pressure 122/74, pulse 89, height 5\' 5"  (1.651 m), last menstrual period 03/08/1992, SpO2 98%.   General: No apparent distress alert and oriented x3 mood and affect normal, dressed appropriately.  HEENT: Pupils equal, extraocular movements intact  Respiratory: Patient's speak in full sentences and does not appear short of breath  Cardiovascular: No lower extremity edema, non tender, no erythema  Arthritic changes of multiple joints.  Positive impingement of the shoulder noted.  Does have some  crepitus noted.  Limited internal range of motion.  Procedure: Real-time Ultrasound Guided Injection of right glenohumeral joint Device: GE Logiq Q7  Ultrasound guided injection is preferred based studies that show increased duration, increased effect, greater accuracy, decreased procedural pain, increased response rate with ultrasound guided versus blind injection.  Verbal informed consent obtained.  Time-out conducted.  Noted no overlying erythema, induration, or other signs of local infection.  Skin prepped in a sterile fashion.  Local anesthesia: Topical Ethyl chloride.  With sterile technique and under real time ultrasound guidance:  Joint visualized.  23g 1  inch needle inserted posterior approach. Pictures taken for needle placement. Patient did have injection  of  2 cc of 0.5% Marcaine, and 1.0 cc of Kenalog 40 mg/dL. Completed without difficulty  Pain immediately resolved suggesting accurate placement of the medication.  Advised to call if fevers/chills, erythema, induration, drainage, or persistent bleeding.  Impression: Technically successful ultrasound guided injection.  Procedure: Real-time Ultrasound Guided Injection of right acromioclavicular joint Device: GE Logiq Q7 Ultrasound guided injection is preferred based studies that show increased duration, increased effect, greater accuracy, decreased procedural pain, increased response rate, and decreased cost with ultrasound guided versus blind injection.  Verbal informed consent obtained.  Time-out conducted.  Noted no overlying erythema, induration, or other signs of local infection.  Skin prepped in a sterile fashion.  Local anesthesia: Topical Ethyl chloride.  With sterile technique and under real time ultrasound guidance: With a 25-gauge half inch needle injected with 0.5 cc of 0.5% Marcaine and 0.5 cc of Kenalog 40 mg/mL Completed without difficulty  Advised to call if fevers/chills, erythema, induration, drainage, or  persistent bleeding.  Impression: Technically successful ultrasound guided injection.    Impression and Recommendations:     The above documentation has been reviewed and is accurate and complete Judi Saa, DO

## 2023-02-18 ENCOUNTER — Encounter: Payer: Self-pay | Admitting: Family Medicine

## 2023-02-18 ENCOUNTER — Ambulatory Visit: Payer: Medicare Other | Admitting: Family Medicine

## 2023-02-18 ENCOUNTER — Other Ambulatory Visit: Payer: Self-pay

## 2023-02-18 VITALS — BP 122/74 | HR 89 | Ht 65.0 in

## 2023-02-18 DIAGNOSIS — M25511 Pain in right shoulder: Secondary | ICD-10-CM | POA: Diagnosis not present

## 2023-02-18 DIAGNOSIS — M19011 Primary osteoarthritis, right shoulder: Secondary | ICD-10-CM | POA: Diagnosis not present

## 2023-02-18 NOTE — Assessment & Plan Note (Signed)
Repeat injection given again.  Discussed with patient about the potential for a CT or MRI of the shoulder.  Patient declined at the moment.  Feels like she would want to continue with the conservative therapy.  Still on ultrasound do not see anything that would be consistent with a significant rotator cuff at the moment.  Significant arthritic changes of the acromioclavicular joint we will need to continue to monitor.  Patient wants to continue with conservative therapy.  Follow-up again in 6 to 8 weeks otherwise.

## 2023-02-18 NOTE — Patient Instructions (Addendum)
Injections in shoulder today Happy Holidays Degenerative arthritis Rotator Cuff arthropathy PRP Read on Inflammation secondary to repetitive motion See you again in 10-12 weeks

## 2023-02-18 NOTE — Assessment & Plan Note (Signed)
Chronic problem again worsening symptoms.  Does seem to be more severe with more hypoechoic changes on ultrasound today.  Once again discussed home exercises.  We discussed advanced imaging which patient declined.  Does not want any surgical intervention.  Follow-up again in 2 months

## 2023-02-21 ENCOUNTER — Ambulatory Visit: Payer: Medicare Other | Admitting: Family Medicine

## 2023-03-15 ENCOUNTER — Ambulatory Visit (INDEPENDENT_AMBULATORY_CARE_PROVIDER_SITE_OTHER): Payer: Medicare Other

## 2023-03-15 ENCOUNTER — Encounter: Payer: Self-pay | Admitting: Family Medicine

## 2023-03-15 ENCOUNTER — Ambulatory Visit: Payer: Medicare Other | Admitting: Family Medicine

## 2023-03-15 ENCOUNTER — Other Ambulatory Visit: Payer: Self-pay

## 2023-03-15 VITALS — BP 134/82 | HR 107 | Ht 65.0 in

## 2023-03-15 DIAGNOSIS — M25511 Pain in right shoulder: Secondary | ICD-10-CM

## 2023-03-15 DIAGNOSIS — M19011 Primary osteoarthritis, right shoulder: Secondary | ICD-10-CM | POA: Diagnosis not present

## 2023-03-15 NOTE — Progress Notes (Signed)
 [name] [address] [place] [address] Phone: [phone] Subjective:   Brooke [name], am serving as a scribe for Dr. Arthea Claudene.  I'm seeing this patient by the request  of:  Brooke Trula SQUIBB, MD  CC: Shoulder pain  [name]  [name] is a 83 y.o. female coming in with complaint of right shoulder pain.  Was seen previously and given injections in both the right shoulder as well as the acromioclavicular joint 1 month ago.  Patient states the right shoulder has been so bad for about 3 days. States that the pain and not being able to move the shoulder came on so fast she is worried. ROM is lacking, was trying to do stretches, but cannot do them at all at the moment. The pain will keep her up or wake her up because moving the arm at all is excruciating. Locates the pain to the top of the right shoulder. Been doing hear, ice, and tylenol  with voltaren.       Past Medical History:  Diagnosis Date   Anxiety    Depression    no history of.   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   Saturday night palsy   Osteopenia    Rheumatic fever    no cardiology issues   Past Surgical History:  Procedure Laterality Date   CATARACT EXTRACTION Right    COLONOSCOPY WITH PROPOFOL  N/A 01/08/2014   Procedure: COLONOSCOPY WITH PROPOFOL ;  Surgeon: Gladis MARLA Louder, MD;  Location: [facility];  Service: Endoscopy;  Laterality: N/A;   PELVIC LAPAROSCOPY     [name]    TONSILLECTOMY     WRIST FRACTURE SURGERY Left    '11-hardware retained   Social History   Socioeconomic History   Marital status: Divorced    Spouse name: Not on file   Number of children: Not on file   Years of education: Not on file   Highest education level: Not on file  Occupational History   Not on file  Tobacco Use   Smoking status: Never    Smokeless tobacco: Never  Substance and Sexual Activity   Alcohol use: Yes    Alcohol/week: 5.0 standard drinks of alcohol    Types: 5 Standard drinks or equivalent per week    Comment: 2 wine glasses per day.   Drug use: No   Sexual activity: Yes    Partners: Male    Comment: husband vasectomy  Other Topics Concern   Not on file  Social History Narrative   Not on file   Social Drivers of Health   Financial Resource Strain: Not on file  Food Insecurity: Not on file  Transportation Needs: Not on file  Physical Activity: Not on file  Stress: Not on file  Social Connections: Not on file   Allergies  Allergen Reactions   Latex     Long period ... Red skin    Family History  Problem Relation Age of Onset   Cancer Sister        ductal invasive cancer   Breast cancer Sister 31   Cancer Maternal Grandmother        colon   Cancer Father        lung (smoker)   Depression Sister    Osteoporosis Sister  Current Outpatient Medications (Endocrine & Metabolic):    denosumab  (PROLIA ) 60 MG/ML SOSY injection, Inject 60 mg into the skin every 6 (six) months.  Current Outpatient Medications (Cardiovascular):    rosuvastatin  (CRESTOR ) 5 MG tablet, Take 2 tablets (10 mg total) by mouth daily.  Current Outpatient Medications (Respiratory):    guaiFENesin (MUCINEX) 600 MG 12 hr tablet, Take 600 mg by mouth 2 (two) times daily as needed for cough.   loratadine (CLARITIN) 10 MG tablet, Take 10 mg by mouth daily as needed for allergies.  Current Outpatient Medications (Analgesics):    naproxen sodium (ALEVE) 220 MG tablet, Take 220 mg by mouth 2 (two) times daily as needed (for pain).   Current Outpatient Medications (Other):    ALPRAZolam (XANAX) 0.5 MG tablet, Take 0.5 mg by mouth daily as needed for sleep.    calcium -vitamin D (OSCAL WITH D) 500-200 MG-UNIT tablet, Take 1 tablet by mouth 2 (two) times daily.   Cholecalciferol (VITAMIN D) 50 MCG (2000 UT) tablet, Take 2,000  Units by mouth daily.   clobetasol cream (TEMOVATE) 0.05 %, Apply 1 application topically 2 (two) times daily as needed (for rash).   diclofenac Sodium (VOLTAREN) 1 % GEL, Apply 2 g topically 4 (four) times daily as needed (for pain).   estradiol (ESTRACE) 0.1 MG/GM vaginal cream, Place 1 Applicatorful vaginally 2 (two) times a week.   Kelp 100 MG TABS, Take 100 mg by mouth daily.   melatonin 3 MG TABS tablet, Take 3 mg by mouth at bedtime.   Misc Natural Products (TART CHERRY ADVANCED PO), Take 1 tablet by mouth daily.   Turmeric 500 MG TABS, Take 500 mg by mouth daily.   Reviewed prior external information including notes and imaging from  primary care provider As well as notes that were available from care everywhere and other healthcare systems.  Past medical history, social, surgical and family history all reviewed in electronic medical record.  No pertanent information unless stated regarding to the chief complaint.   Review of Systems:  No headache, visual changes, nausea, vomiting, diarrhea, constipation, dizziness, abdominal pain, skin rash, fevers, chills, night sweats, weight loss, swollen lymph nodes, body aches, joint swelling, chest pain, shortness of breath, mood changes. POSITIVE muscle aches  Objective  Blood pressure 134/82, pulse (!) 107, height 5' 5 (1.651 m), last menstrual period 03/08/1992, SpO2 99%.   General: No apparent distress alert and oriented x3 mood and affect normal, dressed appropriately.  HEENT: Pupils equal, extraocular movements intact  Respiratory: Patient's speak in full sentences and does not appear short of breath  Cardiovascular: No lower extremity edema, non tender, no erythema  Right shoulder exam shows significant voluntary guarding noted.  Severe pain with even a little bit of range of motion.  No warmness to touch.  Seems to be symmetric to the contralateral side with no significant swelling.  Does have significant weakness though noted of the  right rotator cuff  Limited muscular skeletal ultrasound was performed and interpreted by [name], M  Patient does have significant hypoechoic changes with some increasing in neovascularization in Doppler flow that is likely secondary to recently injured.  Cortical irregularity noted of the lateral humeral place with increasing neovascularization. Impression: Acute rotator cuff injury it appears with questionable occult fracture.    Impression and Recommendations:     The above documentation has been reviewed and is accurate and complete Shriya Aker M Omarr Hann, DO

## 2023-03-15 NOTE — Assessment & Plan Note (Signed)
 Acute worsening pain at this moment.  Concern for an underlying occult fracture.  X-rays do show a mild change noted of the lateral aspect.  Patient does not remember any injury.  Does seem to have an acute change of the rotator cuff noted on ultrasound as well.  Discussed with patient that I do feel we should consider the advanced imaging.  Will get an MRI to make sure that there is no avascular necrosis that is potentially occurring or if there is acute fracture.  Patient does have a past medical history significant for osteopenia and osteoporosis previously.  He is on Prolia .  Patient knows of worsening pain to seek medical attention immediately.

## 2023-03-15 NOTE — Patient Instructions (Addendum)
 Good to see you Had an x ray today Ordered an MRI ordered call [phone] to schedule  We will call you with the results and we will talk about the next steps

## 2023-03-17 ENCOUNTER — Ambulatory Visit
Admission: RE | Admit: 2023-03-17 | Discharge: 2023-03-17 | Disposition: A | Discharge status: Home / Self Care | Payer: Medicare Other | Source: Ambulatory Visit | Attending: Family Medicine | Admitting: Family Medicine

## 2023-03-17 ENCOUNTER — Encounter: Payer: Self-pay | Admitting: Family Medicine

## 2023-03-17 ENCOUNTER — Ambulatory Visit: Payer: Medicare Other | Admitting: Sports Medicine

## 2023-03-17 DIAGNOSIS — M7581 Other shoulder lesions, right shoulder: Secondary | ICD-10-CM | POA: Diagnosis not present

## 2023-03-17 DIAGNOSIS — M25511 Pain in right shoulder: Secondary | ICD-10-CM | POA: Diagnosis not present

## 2023-03-18 ENCOUNTER — Other Ambulatory Visit: Payer: Self-pay

## 2023-03-18 DIAGNOSIS — M25511 Pain in right shoulder: Secondary | ICD-10-CM

## 2023-03-18 MED ORDER — PREDNISONE 20 MG PO TABS: 20.0000 mg | ORAL_TABLET | Freq: Every day | ORAL | 0 refills | Status: AC

## 2023-03-30 ENCOUNTER — Telehealth: Payer: Self-pay | Admitting: Family Medicine

## 2023-03-30 ENCOUNTER — Other Ambulatory Visit: Payer: Self-pay

## 2023-03-30 DIAGNOSIS — M19011 Primary osteoarthritis, right shoulder: Secondary | ICD-10-CM

## 2023-03-30 DIAGNOSIS — M25511 Pain in right shoulder: Secondary | ICD-10-CM

## 2023-03-30 DIAGNOSIS — M24112 Other articular cartilage disorders, left shoulder: Secondary | ICD-10-CM

## 2023-03-30 DIAGNOSIS — R29898 Other symptoms and signs involving the musculoskeletal system: Secondary | ICD-10-CM

## 2023-03-30 NOTE — Telephone Encounter (Signed)
Patient was referred to PT at [facility] but they are needing the order changed to OT.  Can you help?

## 2023-03-30 NOTE — Telephone Encounter (Signed)
Referral to OT placed

## 2023-03-30 NOTE — Telephone Encounter (Signed)
faxed

## 2023-03-31 DIAGNOSIS — M63811 Disorders of muscle in diseases classified elsewhere, right shoulder: Secondary | ICD-10-CM | POA: Diagnosis not present

## 2023-03-31 DIAGNOSIS — R278 Other lack of coordination: Secondary | ICD-10-CM | POA: Diagnosis not present

## 2023-03-31 DIAGNOSIS — M24111 Other articular cartilage disorders, right shoulder: Secondary | ICD-10-CM | POA: Diagnosis not present

## 2023-03-31 DIAGNOSIS — M19011 Primary osteoarthritis, right shoulder: Secondary | ICD-10-CM | POA: Diagnosis not present

## 2023-03-31 DIAGNOSIS — R29898 Other symptoms and signs involving the musculoskeletal system: Secondary | ICD-10-CM | POA: Diagnosis not present

## 2023-03-31 DIAGNOSIS — M25511 Pain in right shoulder: Secondary | ICD-10-CM | POA: Diagnosis not present

## 2023-04-12 DIAGNOSIS — M542 Cervicalgia: Secondary | ICD-10-CM | POA: Diagnosis not present

## 2023-04-12 DIAGNOSIS — M19012 Primary osteoarthritis, left shoulder: Secondary | ICD-10-CM | POA: Diagnosis not present

## 2023-04-12 DIAGNOSIS — M25511 Pain in right shoulder: Secondary | ICD-10-CM | POA: Diagnosis not present

## 2023-04-12 DIAGNOSIS — M19011 Primary osteoarthritis, right shoulder: Secondary | ICD-10-CM | POA: Diagnosis not present

## 2023-04-12 DIAGNOSIS — M25512 Pain in left shoulder: Secondary | ICD-10-CM | POA: Diagnosis not present

## 2023-04-12 DIAGNOSIS — M63811 Disorders of muscle in diseases classified elsewhere, right shoulder: Secondary | ICD-10-CM | POA: Diagnosis not present

## 2023-04-12 DIAGNOSIS — R278 Other lack of coordination: Secondary | ICD-10-CM | POA: Diagnosis not present

## 2023-04-12 DIAGNOSIS — M24111 Other articular cartilage disorders, right shoulder: Secondary | ICD-10-CM | POA: Diagnosis not present

## 2023-04-12 DIAGNOSIS — R29898 Other symptoms and signs involving the musculoskeletal system: Secondary | ICD-10-CM | POA: Diagnosis not present

## 2023-04-19 DIAGNOSIS — M542 Cervicalgia: Secondary | ICD-10-CM | POA: Diagnosis not present

## 2023-04-19 DIAGNOSIS — R278 Other lack of coordination: Secondary | ICD-10-CM | POA: Diagnosis not present

## 2023-04-19 DIAGNOSIS — M19012 Primary osteoarthritis, left shoulder: Secondary | ICD-10-CM | POA: Diagnosis not present

## 2023-04-19 DIAGNOSIS — M25512 Pain in left shoulder: Secondary | ICD-10-CM | POA: Diagnosis not present

## 2023-04-19 DIAGNOSIS — R29898 Other symptoms and signs involving the musculoskeletal system: Secondary | ICD-10-CM | POA: Diagnosis not present

## 2023-04-19 DIAGNOSIS — M25511 Pain in right shoulder: Secondary | ICD-10-CM | POA: Diagnosis not present

## 2023-04-19 DIAGNOSIS — M19011 Primary osteoarthritis, right shoulder: Secondary | ICD-10-CM | POA: Diagnosis not present

## 2023-04-19 DIAGNOSIS — M63811 Disorders of muscle in diseases classified elsewhere, right shoulder: Secondary | ICD-10-CM | POA: Diagnosis not present

## 2023-04-19 DIAGNOSIS — M24111 Other articular cartilage disorders, right shoulder: Secondary | ICD-10-CM | POA: Diagnosis not present

## 2023-04-20 ENCOUNTER — Other Ambulatory Visit: Payer: Self-pay | Admitting: Internal Medicine

## 2023-04-20 DIAGNOSIS — Z1231 Encounter for screening mammogram for malignant neoplasm of breast: Secondary | ICD-10-CM

## 2023-04-22 DIAGNOSIS — M19012 Primary osteoarthritis, left shoulder: Secondary | ICD-10-CM | POA: Diagnosis not present

## 2023-04-22 DIAGNOSIS — M25511 Pain in right shoulder: Secondary | ICD-10-CM | POA: Diagnosis not present

## 2023-04-22 DIAGNOSIS — M542 Cervicalgia: Secondary | ICD-10-CM | POA: Diagnosis not present

## 2023-04-22 DIAGNOSIS — M63811 Disorders of muscle in diseases classified elsewhere, right shoulder: Secondary | ICD-10-CM | POA: Diagnosis not present

## 2023-04-22 DIAGNOSIS — M24111 Other articular cartilage disorders, right shoulder: Secondary | ICD-10-CM | POA: Diagnosis not present

## 2023-04-22 DIAGNOSIS — M19011 Primary osteoarthritis, right shoulder: Secondary | ICD-10-CM | POA: Diagnosis not present

## 2023-04-22 DIAGNOSIS — R278 Other lack of coordination: Secondary | ICD-10-CM | POA: Diagnosis not present

## 2023-04-22 DIAGNOSIS — R29898 Other symptoms and signs involving the musculoskeletal system: Secondary | ICD-10-CM | POA: Diagnosis not present

## 2023-04-22 DIAGNOSIS — M25512 Pain in left shoulder: Secondary | ICD-10-CM | POA: Diagnosis not present

## 2023-04-26 DIAGNOSIS — M24111 Other articular cartilage disorders, right shoulder: Secondary | ICD-10-CM | POA: Diagnosis not present

## 2023-04-26 DIAGNOSIS — M63811 Disorders of muscle in diseases classified elsewhere, right shoulder: Secondary | ICD-10-CM | POA: Diagnosis not present

## 2023-04-26 DIAGNOSIS — R29898 Other symptoms and signs involving the musculoskeletal system: Secondary | ICD-10-CM | POA: Diagnosis not present

## 2023-04-26 DIAGNOSIS — M19011 Primary osteoarthritis, right shoulder: Secondary | ICD-10-CM | POA: Diagnosis not present

## 2023-04-26 DIAGNOSIS — R278 Other lack of coordination: Secondary | ICD-10-CM | POA: Diagnosis not present

## 2023-04-26 DIAGNOSIS — M19012 Primary osteoarthritis, left shoulder: Secondary | ICD-10-CM | POA: Diagnosis not present

## 2023-04-26 DIAGNOSIS — M25511 Pain in right shoulder: Secondary | ICD-10-CM | POA: Diagnosis not present

## 2023-04-26 DIAGNOSIS — M542 Cervicalgia: Secondary | ICD-10-CM | POA: Diagnosis not present

## 2023-04-26 DIAGNOSIS — M25512 Pain in left shoulder: Secondary | ICD-10-CM | POA: Diagnosis not present

## 2023-04-26 NOTE — Progress Notes (Signed)
 [name] [address] [place] [address] Phone: [phone] Subjective:   [name], am serving as a scribe for Dr. Antoine Primas.  I'm seeing this patient by the request  of:  Thana Ates, MD  CC: Right shoulder pain  [id number]  03/15/2023 Acute worsening pain at this moment.  Concern for an underlying occult fracture.  X-rays do show a mild change noted of the lateral aspect.  Patient does not remember any injury.  Does seem to have an acute change of the rotator cuff noted on ultrasound as well.  Discussed with patient that I do feel we should consider the advanced imaging.  Will get an MRI to make sure that there is no avascular necrosis that is potentially occurring or if there is acute fracture.  Patient does have a past medical history significant for osteopenia and osteoporosis previously.  He is on Prolia.  Patient knows of worsening pain to seek medical attention immediately.     Updated 04/29/2023 AAVA [name] is a 83 y.o. female coming in with complaint of R shoulder pain. Patient has good and bad days. Going through OT.   Also said that L shoulder is starting to bother her. Therapist is concerned about spinal issues contributing to pain in B shoulders. Patient is using posture brace now.   MRI IMPRESSION: 1. Supraspinatus tendinosis with partial intrasubstance and bursal surface insertional tearing. No full-thickness rotator cuff tear, tendon retraction or muscular atrophy. 2. Moderate amount of complex fluid within the subacromial-subdeltoid bursa consistent with bursitis. 3. The additional components of the rotator cuff, biceps tendon and labrum appear intact. 4. No acute osseous findings.     Past Medical History:  Diagnosis Date   Anxiety    Depression    no history of.   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   "Saturday night palsy"   Osteopenia    Rheumatic fever    no cardiology issues   Past Surgical History:  Procedure Laterality Date   CATARACT EXTRACTION Right    COLONOSCOPY WITH PROPOFOL N/A 01/08/2014   Procedure: COLONOSCOPY WITH PROPOFOL;  Surgeon: Charolett Bumpers, MD;  Location: [facility];  Service: Endoscopy;  Laterality: N/A;   PELVIC LAPAROSCOPY     [name]   TONSILLECTOMY     WRIST FRACTURE SURGERY Left    '11-hardware retained   Social History   Socioeconomic History   Marital status: Divorced    Spouse name: Not on file   Number of children: Not on file   Years of education: Not on file   Highest education level: Not on file  Occupational History   Not on file  Tobacco Use   Smoking status: Never   Smokeless tobacco: Never  Substance and Sexual Activity   Alcohol use: Yes    Alcohol/week: 5.0 standard drinks of alcohol    Types: 5 Standard drinks or equivalent per week    Comment: 2 wine glasses per day.   Drug use: No   Sexual activity: Yes    Partners: Male    Comment: husband vasectomy  Other Topics Concern   Not on file  Social History Narrative   Not on file   Social Drivers of Health   Financial Resource Strain: Not on file  Food Insecurity: Not  on file  Transportation Needs: Not on file  Physical Activity: Not on file  Stress: Not on file  Social Connections: Not on file   Allergies  Allergen Reactions   Latex     Long period ... Red skin    Family History  Problem Relation Age of Onset   Cancer Sister        ductal invasive cancer   Breast cancer Sister 11   Cancer Maternal Grandmother        colon   Cancer Father        lung (smoker)   Depression Sister    Osteoporosis Sister     Current Outpatient Medications (Endocrine & Metabolic):    denosumab (PROLIA) 60 MG/ML SOSY injection, Inject 60 mg into the skin every 6 (six) months.   predniSONE (DELTASONE) 20 MG tablet, Take 1 tablet (20 mg total)  by mouth daily with breakfast.  Current Outpatient Medications (Cardiovascular):    rosuvastatin (CRESTOR) 5 MG tablet, Take 2 tablets (10 mg total) by mouth daily.  Current Outpatient Medications (Respiratory):    guaiFENesin (MUCINEX) 600 MG 12 hr tablet, Take 600 mg by mouth 2 (two) times daily as needed for cough.   loratadine (CLARITIN) 10 MG tablet, Take 10 mg by mouth daily as needed for allergies.  Current Outpatient Medications (Analgesics):    naproxen sodium (ALEVE) 220 MG tablet, Take 220 mg by mouth 2 (two) times daily as needed (for pain).   Current Outpatient Medications (Other):    ALPRAZolam (XANAX) 0.5 MG tablet, Take 0.5 mg by mouth daily as needed for sleep.    calcium-vitamin D (OSCAL WITH D) 500-200 MG-UNIT tablet, Take 1 tablet by mouth 2 (two) times daily.   Cholecalciferol (VITAMIN D) 50 MCG (2000 UT) tablet, Take 2,000 Units by mouth daily.   clobetasol cream (TEMOVATE) 0.05 %, Apply 1 application topically 2 (two) times daily as needed (for rash).   diclofenac Sodium (VOLTAREN) 1 % GEL, Apply 2 g topically 4 (four) times daily as needed (for pain).   estradiol (ESTRACE) 0.1 MG/GM vaginal cream, Place 1 Applicatorful vaginally 2 (two) times a week.   Kelp 100 MG TABS, Take 100 mg by mouth daily.   melatonin 3 MG TABS tablet, Take 3 mg by mouth at bedtime.   Misc Natural Products (TART CHERRY ADVANCED PO), Take 1 tablet by mouth daily.   Turmeric 500 MG TABS, Take 500 mg by mouth daily.   Reviewed prior external information including notes and imaging from  primary care provider As well as notes that were available from care everywhere and other healthcare systems.  Past medical history, social, surgical and family history all reviewed in electronic medical record.  No pertanent information unless stated regarding to the chief complaint.   Review of Systems:  No headache, visual changes, nausea, vomiting, diarrhea, constipation, dizziness, abdominal pain,  skin rash, fevers, chills, night sweats, weight loss, swollen lymph nodes, body aches, joint swelling, chest pain, shortness of breath, mood changes. POSITIVE muscle aches  Objective  Blood pressure 110/78, height 5\' 5"  (1.651 m), last menstrual period 03/08/1992.   General: No apparent distress alert and oriented x3 mood and affect normal, dressed appropriately.  HEENT: Pupils equal, extraocular movements intact  Respiratory: Patient's speak in full sentences and does not appear short of breath  Cardiovascular: No lower extremity edema, non tender, no erythema  Right shoulder exam shows patient does have improvement in range of motion almost immediately.  Does have some limitation  in movement of the neck though noted.  Rotator cuff strength seems to be improved.  Patient's severe pain that was noted previously is not noted on exam today.   Limited muscular skeletal ultrasound was performed and interpreted by Antoine Primas, M  Limited ultrasound shows no cortical irregularity anymore at this moment.  Still has some hypoechoic changes but significantly improved by at least 85% in the subacromial area.  Rotator cuff does have some calcific changes but otherwise fairly unremarkable.   Impression and Recommendations:     The above documentation has been reviewed and is accurate and complete Judi Saa, DO

## 2023-04-28 DIAGNOSIS — M25511 Pain in right shoulder: Secondary | ICD-10-CM | POA: Diagnosis not present

## 2023-04-28 DIAGNOSIS — M24111 Other articular cartilage disorders, right shoulder: Secondary | ICD-10-CM | POA: Diagnosis not present

## 2023-04-28 DIAGNOSIS — M19011 Primary osteoarthritis, right shoulder: Secondary | ICD-10-CM | POA: Diagnosis not present

## 2023-04-28 DIAGNOSIS — M542 Cervicalgia: Secondary | ICD-10-CM | POA: Diagnosis not present

## 2023-04-28 DIAGNOSIS — M19012 Primary osteoarthritis, left shoulder: Secondary | ICD-10-CM | POA: Diagnosis not present

## 2023-04-28 DIAGNOSIS — M25512 Pain in left shoulder: Secondary | ICD-10-CM | POA: Diagnosis not present

## 2023-04-28 DIAGNOSIS — R278 Other lack of coordination: Secondary | ICD-10-CM | POA: Diagnosis not present

## 2023-04-28 DIAGNOSIS — R29898 Other symptoms and signs involving the musculoskeletal system: Secondary | ICD-10-CM | POA: Diagnosis not present

## 2023-04-28 DIAGNOSIS — M63811 Disorders of muscle in diseases classified elsewhere, right shoulder: Secondary | ICD-10-CM | POA: Diagnosis not present

## 2023-04-29 ENCOUNTER — Ambulatory Visit: Payer: HMO

## 2023-04-29 ENCOUNTER — Ambulatory Visit: Payer: HMO | Admitting: Family Medicine

## 2023-04-29 ENCOUNTER — Other Ambulatory Visit: Payer: Self-pay

## 2023-04-29 VITALS — BP 110/78 | Ht 65.0 in

## 2023-04-29 DIAGNOSIS — M25512 Pain in left shoulder: Secondary | ICD-10-CM

## 2023-04-29 DIAGNOSIS — M47812 Spondylosis without myelopathy or radiculopathy, cervical region: Secondary | ICD-10-CM | POA: Diagnosis not present

## 2023-04-29 DIAGNOSIS — M25511 Pain in right shoulder: Secondary | ICD-10-CM

## 2023-04-29 DIAGNOSIS — M546 Pain in thoracic spine: Secondary | ICD-10-CM

## 2023-04-29 DIAGNOSIS — M19011 Primary osteoarthritis, right shoulder: Secondary | ICD-10-CM

## 2023-04-29 DIAGNOSIS — M542 Cervicalgia: Secondary | ICD-10-CM

## 2023-04-29 DIAGNOSIS — M47814 Spondylosis without myelopathy or radiculopathy, thoracic region: Secondary | ICD-10-CM | POA: Diagnosis not present

## 2023-04-29 NOTE — Patient Instructions (Addendum)
Keep working with OT Xray of neck today Swelling in R shoulder is better and improving Keep hands in peripheral vision Consider adding band work in 2-4 weeks See me in 2 months

## 2023-04-30 ENCOUNTER — Encounter: Payer: Self-pay | Admitting: Family Medicine

## 2023-04-30 NOTE — Assessment & Plan Note (Signed)
 Known arthritic changes noted.  Discussed icing regimen and home exercises, discussed with patient that I do believe that she will continue to respond extremely well to physical therapy and Occupational Therapy.  Will extend so it is also working on her neck as well as the left shoulder.  No significant changes in medication at the moment.  Worsening pain would like to highly recommend the possibility of injections.  Patient would like to hold on that at the moment.  Follow-up with me again in 2 months

## 2023-05-03 NOTE — Addendum Note (Signed)
 Addended by: Evon Slack on: 05/03/2023 11:51 AM   Modules accepted: Orders

## 2023-05-04 DIAGNOSIS — R278 Other lack of coordination: Secondary | ICD-10-CM | POA: Diagnosis not present

## 2023-05-04 DIAGNOSIS — M542 Cervicalgia: Secondary | ICD-10-CM | POA: Diagnosis not present

## 2023-05-04 DIAGNOSIS — M25511 Pain in right shoulder: Secondary | ICD-10-CM | POA: Diagnosis not present

## 2023-05-04 DIAGNOSIS — M63811 Disorders of muscle in diseases classified elsewhere, right shoulder: Secondary | ICD-10-CM | POA: Diagnosis not present

## 2023-05-04 DIAGNOSIS — R29898 Other symptoms and signs involving the musculoskeletal system: Secondary | ICD-10-CM | POA: Diagnosis not present

## 2023-05-04 DIAGNOSIS — M19012 Primary osteoarthritis, left shoulder: Secondary | ICD-10-CM | POA: Diagnosis not present

## 2023-05-04 DIAGNOSIS — M19011 Primary osteoarthritis, right shoulder: Secondary | ICD-10-CM | POA: Diagnosis not present

## 2023-05-04 DIAGNOSIS — M24111 Other articular cartilage disorders, right shoulder: Secondary | ICD-10-CM | POA: Diagnosis not present

## 2023-05-04 DIAGNOSIS — M25512 Pain in left shoulder: Secondary | ICD-10-CM | POA: Diagnosis not present

## 2023-05-06 DIAGNOSIS — M19012 Primary osteoarthritis, left shoulder: Secondary | ICD-10-CM | POA: Diagnosis not present

## 2023-05-06 DIAGNOSIS — M542 Cervicalgia: Secondary | ICD-10-CM | POA: Diagnosis not present

## 2023-05-06 DIAGNOSIS — M25511 Pain in right shoulder: Secondary | ICD-10-CM | POA: Diagnosis not present

## 2023-05-06 DIAGNOSIS — R278 Other lack of coordination: Secondary | ICD-10-CM | POA: Diagnosis not present

## 2023-05-06 DIAGNOSIS — M25512 Pain in left shoulder: Secondary | ICD-10-CM | POA: Diagnosis not present

## 2023-05-06 DIAGNOSIS — M24111 Other articular cartilage disorders, right shoulder: Secondary | ICD-10-CM | POA: Diagnosis not present

## 2023-05-06 DIAGNOSIS — M19011 Primary osteoarthritis, right shoulder: Secondary | ICD-10-CM | POA: Diagnosis not present

## 2023-05-06 DIAGNOSIS — R29898 Other symptoms and signs involving the musculoskeletal system: Secondary | ICD-10-CM | POA: Diagnosis not present

## 2023-05-06 DIAGNOSIS — M63811 Disorders of muscle in diseases classified elsewhere, right shoulder: Secondary | ICD-10-CM | POA: Diagnosis not present

## 2023-05-09 DIAGNOSIS — M19012 Primary osteoarthritis, left shoulder: Secondary | ICD-10-CM | POA: Diagnosis not present

## 2023-05-09 DIAGNOSIS — M63811 Disorders of muscle in diseases classified elsewhere, right shoulder: Secondary | ICD-10-CM | POA: Diagnosis not present

## 2023-05-09 DIAGNOSIS — M25512 Pain in left shoulder: Secondary | ICD-10-CM | POA: Diagnosis not present

## 2023-05-09 DIAGNOSIS — R29898 Other symptoms and signs involving the musculoskeletal system: Secondary | ICD-10-CM | POA: Diagnosis not present

## 2023-05-09 DIAGNOSIS — M19011 Primary osteoarthritis, right shoulder: Secondary | ICD-10-CM | POA: Diagnosis not present

## 2023-05-09 DIAGNOSIS — R278 Other lack of coordination: Secondary | ICD-10-CM | POA: Diagnosis not present

## 2023-05-09 DIAGNOSIS — M25511 Pain in right shoulder: Secondary | ICD-10-CM | POA: Diagnosis not present

## 2023-05-09 DIAGNOSIS — M542 Cervicalgia: Secondary | ICD-10-CM | POA: Diagnosis not present

## 2023-05-09 DIAGNOSIS — M24111 Other articular cartilage disorders, right shoulder: Secondary | ICD-10-CM | POA: Diagnosis not present

## 2023-05-11 ENCOUNTER — Encounter: Payer: Self-pay | Admitting: Family Medicine

## 2023-05-12 DIAGNOSIS — M19012 Primary osteoarthritis, left shoulder: Secondary | ICD-10-CM | POA: Diagnosis not present

## 2023-05-12 DIAGNOSIS — M24111 Other articular cartilage disorders, right shoulder: Secondary | ICD-10-CM | POA: Diagnosis not present

## 2023-05-12 DIAGNOSIS — M19011 Primary osteoarthritis, right shoulder: Secondary | ICD-10-CM | POA: Diagnosis not present

## 2023-05-12 DIAGNOSIS — R278 Other lack of coordination: Secondary | ICD-10-CM | POA: Diagnosis not present

## 2023-05-12 DIAGNOSIS — M63811 Disorders of muscle in diseases classified elsewhere, right shoulder: Secondary | ICD-10-CM | POA: Diagnosis not present

## 2023-05-12 DIAGNOSIS — M542 Cervicalgia: Secondary | ICD-10-CM | POA: Diagnosis not present

## 2023-05-12 DIAGNOSIS — M25511 Pain in right shoulder: Secondary | ICD-10-CM | POA: Diagnosis not present

## 2023-05-12 DIAGNOSIS — R29898 Other symptoms and signs involving the musculoskeletal system: Secondary | ICD-10-CM | POA: Diagnosis not present

## 2023-05-12 DIAGNOSIS — M25512 Pain in left shoulder: Secondary | ICD-10-CM | POA: Diagnosis not present

## 2023-05-16 DIAGNOSIS — M25511 Pain in right shoulder: Secondary | ICD-10-CM | POA: Diagnosis not present

## 2023-05-16 DIAGNOSIS — M24111 Other articular cartilage disorders, right shoulder: Secondary | ICD-10-CM | POA: Diagnosis not present

## 2023-05-16 DIAGNOSIS — M542 Cervicalgia: Secondary | ICD-10-CM | POA: Diagnosis not present

## 2023-05-16 DIAGNOSIS — M25512 Pain in left shoulder: Secondary | ICD-10-CM | POA: Diagnosis not present

## 2023-05-16 DIAGNOSIS — M63811 Disorders of muscle in diseases classified elsewhere, right shoulder: Secondary | ICD-10-CM | POA: Diagnosis not present

## 2023-05-16 DIAGNOSIS — R278 Other lack of coordination: Secondary | ICD-10-CM | POA: Diagnosis not present

## 2023-05-16 DIAGNOSIS — R29898 Other symptoms and signs involving the musculoskeletal system: Secondary | ICD-10-CM | POA: Diagnosis not present

## 2023-05-16 DIAGNOSIS — M19012 Primary osteoarthritis, left shoulder: Secondary | ICD-10-CM | POA: Diagnosis not present

## 2023-05-16 DIAGNOSIS — M19011 Primary osteoarthritis, right shoulder: Secondary | ICD-10-CM | POA: Diagnosis not present

## 2023-05-19 ENCOUNTER — Ambulatory Visit: Payer: Self-pay

## 2023-05-19 DIAGNOSIS — M24111 Other articular cartilage disorders, right shoulder: Secondary | ICD-10-CM | POA: Diagnosis not present

## 2023-05-19 DIAGNOSIS — M63811 Disorders of muscle in diseases classified elsewhere, right shoulder: Secondary | ICD-10-CM | POA: Diagnosis not present

## 2023-05-19 DIAGNOSIS — M19011 Primary osteoarthritis, right shoulder: Secondary | ICD-10-CM | POA: Diagnosis not present

## 2023-05-19 DIAGNOSIS — M19012 Primary osteoarthritis, left shoulder: Secondary | ICD-10-CM | POA: Diagnosis not present

## 2023-05-19 DIAGNOSIS — M25512 Pain in left shoulder: Secondary | ICD-10-CM | POA: Diagnosis not present

## 2023-05-19 DIAGNOSIS — R278 Other lack of coordination: Secondary | ICD-10-CM | POA: Diagnosis not present

## 2023-05-19 DIAGNOSIS — M25511 Pain in right shoulder: Secondary | ICD-10-CM | POA: Diagnosis not present

## 2023-05-19 DIAGNOSIS — R29898 Other symptoms and signs involving the musculoskeletal system: Secondary | ICD-10-CM | POA: Diagnosis not present

## 2023-05-19 DIAGNOSIS — M542 Cervicalgia: Secondary | ICD-10-CM | POA: Diagnosis not present

## 2023-05-23 DIAGNOSIS — M24111 Other articular cartilage disorders, right shoulder: Secondary | ICD-10-CM | POA: Diagnosis not present

## 2023-05-23 DIAGNOSIS — M542 Cervicalgia: Secondary | ICD-10-CM | POA: Diagnosis not present

## 2023-05-23 DIAGNOSIS — R29898 Other symptoms and signs involving the musculoskeletal system: Secondary | ICD-10-CM | POA: Diagnosis not present

## 2023-05-23 DIAGNOSIS — M25512 Pain in left shoulder: Secondary | ICD-10-CM | POA: Diagnosis not present

## 2023-05-23 DIAGNOSIS — M63811 Disorders of muscle in diseases classified elsewhere, right shoulder: Secondary | ICD-10-CM | POA: Diagnosis not present

## 2023-05-23 DIAGNOSIS — M19011 Primary osteoarthritis, right shoulder: Secondary | ICD-10-CM | POA: Diagnosis not present

## 2023-05-23 DIAGNOSIS — M25511 Pain in right shoulder: Secondary | ICD-10-CM | POA: Diagnosis not present

## 2023-05-23 DIAGNOSIS — M19012 Primary osteoarthritis, left shoulder: Secondary | ICD-10-CM | POA: Diagnosis not present

## 2023-05-23 DIAGNOSIS — R278 Other lack of coordination: Secondary | ICD-10-CM | POA: Diagnosis not present

## 2023-05-24 ENCOUNTER — Ambulatory Visit (HOSPITAL_BASED_OUTPATIENT_CLINIC_OR_DEPARTMENT_OTHER)

## 2023-05-24 ENCOUNTER — Ambulatory Visit (HOSPITAL_BASED_OUTPATIENT_CLINIC_OR_DEPARTMENT_OTHER): Admitting: Student

## 2023-05-24 DIAGNOSIS — M79645 Pain in left finger(s): Secondary | ICD-10-CM

## 2023-05-24 NOTE — Progress Notes (Signed)
 Chief Complaint: Left middle finger pain     History of Present Illness:    Brooke [name] is a 83 y.o. right-hand-dominant female presenting today for acute pain of the left middle finger.  Patient states that this began this morning when she woke up without any known cause or injury.  Pain is located mainly over the PIP joint which she rates as severe and that her finger feels stiff.  Does have a history of a trigger finger episode involving this finger that improved on its own.  Reports that the finger does feel swollen but denies any redness, fever, or chills.   Surgical History:   None  PMH/PSH/Family History/Social History/Meds/Allergies:    Past Medical History:  Diagnosis Date   Anxiety    Depression    no history of.   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   "Saturday night palsy"   Osteopenia    Rheumatic fever    no cardiology issues   Past Surgical History:  Procedure Laterality Date   CATARACT EXTRACTION Right    COLONOSCOPY WITH PROPOFOL N/A 01/08/2014   Procedure: COLONOSCOPY WITH PROPOFOL;  Surgeon: Charolett Bumpers, MD;  Location: [facility];  Service: Endoscopy;  Laterality: N/A;   PELVIC LAPAROSCOPY     [name]   TONSILLECTOMY     WRIST FRACTURE SURGERY Left    '11-hardware retained   Social History   Socioeconomic History   Marital status: Divorced    Spouse name: Not on file   Number of children: Not on file   Years of education: Not on file   Highest education level: Not on file  Occupational History   Not on file  Tobacco Use   Smoking status: Never   Smokeless tobacco: Never  Substance and Sexual Activity   Alcohol use: Yes    Alcohol/week: 5.0 standard drinks of alcohol    Types: 5 Standard drinks or equivalent per week    Comment: 2 wine glasses per day.   Drug use: No    Sexual activity: Yes    Partners: Male    Comment: husband vasectomy  Other Topics Concern   Not on file  Social History Narrative   Not on file   Social Drivers of Health   Financial Resource Strain: Not on file  Food Insecurity: Not on file  Transportation Needs: Not on file  Physical Activity: Not on file  Stress: Not on file  Social Connections: Not on file   Family History  Problem Relation Age of Onset   Cancer Sister        ductal invasive cancer   Breast cancer Sister 44   Cancer Maternal Grandmother        colon   Cancer Father        lung (smoker)   Depression Sister    Osteoporosis Sister    Allergies  Allergen Reactions   Latex     Long period ... Red skin    Current Outpatient Medications  Medication Sig Dispense Refill   ALPRAZolam (XANAX) 0.5 MG tablet Take 0.5 mg by mouth daily as needed for sleep.  calcium-vitamin D (OSCAL WITH D) 500-200 MG-UNIT tablet Take 1 tablet by mouth 2 (two) times daily.     Cholecalciferol (VITAMIN D) 50 MCG (2000 UT) tablet Take 2,000 Units by mouth daily.     clobetasol cream (TEMOVATE) 0.05 % Apply 1 application topically 2 (two) times daily as needed (for rash).     denosumab (PROLIA) 60 MG/ML SOSY injection Inject 60 mg into the skin every 6 (six) months.     diclofenac Sodium (VOLTAREN) 1 % GEL Apply 2 g topically 4 (four) times daily as needed (for pain).     estradiol (ESTRACE) 0.1 MG/GM vaginal cream Place 1 Applicatorful vaginally 2 (two) times a week.     guaiFENesin (MUCINEX) 600 MG 12 hr tablet Take 600 mg by mouth 2 (two) times daily as needed for cough.     Kelp 100 MG TABS Take 100 mg by mouth daily.     loratadine (CLARITIN) 10 MG tablet Take 10 mg by mouth daily as needed for allergies.     melatonin 3 MG TABS tablet Take 3 mg by mouth at bedtime.     Misc Natural Products (TART CHERRY ADVANCED PO) Take 1 tablet by mouth daily.     naproxen sodium (ALEVE) 220 MG tablet Take 220 mg by mouth 2 (two) times  daily as needed (for pain).     predniSONE (DELTASONE) 20 MG tablet Take 1 tablet (20 mg total) by mouth daily with breakfast. 5 tablet 0   rosuvastatin (CRESTOR) 5 MG tablet Take 2 tablets (10 mg total) by mouth daily. 60 tablet 4   Turmeric 500 MG TABS Take 500 mg by mouth daily.     No current facility-administered medications for this visit.   No results found.  Review of Systems:   A ROS was performed including pertinent positives and negatives as documented in the HPI.  Physical Exam :   Constitutional: NAD and appears stated age Neurological: Alert and oriented Psych: Appropriate affect and cooperative Last menstrual period 03/08/1992.   Comprehensive Musculoskeletal Exam:    Exam of the left hand demonstrates mild swelling over the PIP, middle phalanx, and DIP of the third finger.  Tenderness with palpation mainly over the dorsal aspect of the PIP joint.  Pain and decreased range of motion noted with active flexion.  No pain with passive extension of the finger.  Radial pulse 2+.  Distal neurosensory exam intact.  Imaging:   Xray (right middle finger 3 views): Negative for acute bony abnormality.  Mild to moderate osteoarthritis noted within the PIP and DIP joints.   I personally reviewed and interpreted the radiographs.   Assessment:   83 y.o. female with atraumatic pain of the left middle finger that began this morning.  The finger does appear mildly swollen but no signs or symptoms to suggest developing infection or flexor tenosynovitis.  X-rays do show evidence of some osteoarthritis, more notable within the PIP joint.  I have recommended proceeding with topical and/or oral NSAIDs over the next few days to help control pain and inflammation.  I will also have her buddy tape to the adjacent finger which was placed in clinic and did provide some symptomatic relief.  Will plan to have her keep me updated on her symptoms over the coming days, and may consider use of a steroid  pack if no improvement with NSAIDs.  Return precautions given on red flag symptoms.  Plan :    -Proceed with buddy taping and use of NSAIDs -  Return to clinic if symptoms continue to persist or worsen     I personally saw and evaluated the patient, and participated in the management and treatment plan.  Hazle Nordmann, PA-C Orthopedics

## 2023-05-25 ENCOUNTER — Telehealth (HOSPITAL_BASED_OUTPATIENT_CLINIC_OR_DEPARTMENT_OTHER): Payer: Self-pay | Admitting: Student

## 2023-05-25 NOTE — Telephone Encounter (Signed)
 Patient states her finger is better this morning and all she did was use volturen cream. She said thanks for your patience

## 2023-05-27 DIAGNOSIS — M25511 Pain in right shoulder: Secondary | ICD-10-CM | POA: Diagnosis not present

## 2023-05-27 DIAGNOSIS — R29898 Other symptoms and signs involving the musculoskeletal system: Secondary | ICD-10-CM | POA: Diagnosis not present

## 2023-05-27 DIAGNOSIS — R278 Other lack of coordination: Secondary | ICD-10-CM | POA: Diagnosis not present

## 2023-05-27 DIAGNOSIS — M63811 Disorders of muscle in diseases classified elsewhere, right shoulder: Secondary | ICD-10-CM | POA: Diagnosis not present

## 2023-05-27 DIAGNOSIS — M19011 Primary osteoarthritis, right shoulder: Secondary | ICD-10-CM | POA: Diagnosis not present

## 2023-05-27 DIAGNOSIS — M24111 Other articular cartilage disorders, right shoulder: Secondary | ICD-10-CM | POA: Diagnosis not present

## 2023-05-27 DIAGNOSIS — M542 Cervicalgia: Secondary | ICD-10-CM | POA: Diagnosis not present

## 2023-05-27 DIAGNOSIS — M19012 Primary osteoarthritis, left shoulder: Secondary | ICD-10-CM | POA: Diagnosis not present

## 2023-05-27 DIAGNOSIS — M25512 Pain in left shoulder: Secondary | ICD-10-CM | POA: Diagnosis not present

## 2023-05-30 DIAGNOSIS — M63811 Disorders of muscle in diseases classified elsewhere, right shoulder: Secondary | ICD-10-CM | POA: Diagnosis not present

## 2023-05-30 DIAGNOSIS — M19012 Primary osteoarthritis, left shoulder: Secondary | ICD-10-CM | POA: Diagnosis not present

## 2023-05-30 DIAGNOSIS — M25511 Pain in right shoulder: Secondary | ICD-10-CM | POA: Diagnosis not present

## 2023-05-30 DIAGNOSIS — M24111 Other articular cartilage disorders, right shoulder: Secondary | ICD-10-CM | POA: Diagnosis not present

## 2023-05-30 DIAGNOSIS — R29898 Other symptoms and signs involving the musculoskeletal system: Secondary | ICD-10-CM | POA: Diagnosis not present

## 2023-05-30 DIAGNOSIS — M19011 Primary osteoarthritis, right shoulder: Secondary | ICD-10-CM | POA: Diagnosis not present

## 2023-05-30 DIAGNOSIS — M542 Cervicalgia: Secondary | ICD-10-CM | POA: Diagnosis not present

## 2023-05-30 DIAGNOSIS — R278 Other lack of coordination: Secondary | ICD-10-CM | POA: Diagnosis not present

## 2023-05-30 DIAGNOSIS — M25512 Pain in left shoulder: Secondary | ICD-10-CM | POA: Diagnosis not present

## 2023-06-03 DIAGNOSIS — R278 Other lack of coordination: Secondary | ICD-10-CM | POA: Diagnosis not present

## 2023-06-03 DIAGNOSIS — M24111 Other articular cartilage disorders, right shoulder: Secondary | ICD-10-CM | POA: Diagnosis not present

## 2023-06-03 DIAGNOSIS — M25512 Pain in left shoulder: Secondary | ICD-10-CM | POA: Diagnosis not present

## 2023-06-03 DIAGNOSIS — M19012 Primary osteoarthritis, left shoulder: Secondary | ICD-10-CM | POA: Diagnosis not present

## 2023-06-03 DIAGNOSIS — M542 Cervicalgia: Secondary | ICD-10-CM | POA: Diagnosis not present

## 2023-06-03 DIAGNOSIS — M63811 Disorders of muscle in diseases classified elsewhere, right shoulder: Secondary | ICD-10-CM | POA: Diagnosis not present

## 2023-06-03 DIAGNOSIS — R29898 Other symptoms and signs involving the musculoskeletal system: Secondary | ICD-10-CM | POA: Diagnosis not present

## 2023-06-03 DIAGNOSIS — M25511 Pain in right shoulder: Secondary | ICD-10-CM | POA: Diagnosis not present

## 2023-06-03 DIAGNOSIS — M19011 Primary osteoarthritis, right shoulder: Secondary | ICD-10-CM | POA: Diagnosis not present

## 2023-06-06 DIAGNOSIS — M542 Cervicalgia: Secondary | ICD-10-CM | POA: Diagnosis not present

## 2023-06-06 DIAGNOSIS — M25511 Pain in right shoulder: Secondary | ICD-10-CM | POA: Diagnosis not present

## 2023-06-06 DIAGNOSIS — M63811 Disorders of muscle in diseases classified elsewhere, right shoulder: Secondary | ICD-10-CM | POA: Diagnosis not present

## 2023-06-06 DIAGNOSIS — M25512 Pain in left shoulder: Secondary | ICD-10-CM | POA: Diagnosis not present

## 2023-06-06 DIAGNOSIS — M19012 Primary osteoarthritis, left shoulder: Secondary | ICD-10-CM | POA: Diagnosis not present

## 2023-06-06 DIAGNOSIS — M24111 Other articular cartilage disorders, right shoulder: Secondary | ICD-10-CM | POA: Diagnosis not present

## 2023-06-06 DIAGNOSIS — R29898 Other symptoms and signs involving the musculoskeletal system: Secondary | ICD-10-CM | POA: Diagnosis not present

## 2023-06-06 DIAGNOSIS — R278 Other lack of coordination: Secondary | ICD-10-CM | POA: Diagnosis not present

## 2023-06-06 DIAGNOSIS — M19011 Primary osteoarthritis, right shoulder: Secondary | ICD-10-CM | POA: Diagnosis not present

## 2023-06-08 ENCOUNTER — Ambulatory Visit: Payer: Self-pay

## 2023-06-13 DIAGNOSIS — M19012 Primary osteoarthritis, left shoulder: Secondary | ICD-10-CM | POA: Diagnosis not present

## 2023-06-13 DIAGNOSIS — M19011 Primary osteoarthritis, right shoulder: Secondary | ICD-10-CM | POA: Diagnosis not present

## 2023-06-13 DIAGNOSIS — M25511 Pain in right shoulder: Secondary | ICD-10-CM | POA: Diagnosis not present

## 2023-06-13 DIAGNOSIS — M25512 Pain in left shoulder: Secondary | ICD-10-CM | POA: Diagnosis not present

## 2023-06-13 DIAGNOSIS — R278 Other lack of coordination: Secondary | ICD-10-CM | POA: Diagnosis not present

## 2023-06-13 DIAGNOSIS — R29898 Other symptoms and signs involving the musculoskeletal system: Secondary | ICD-10-CM | POA: Diagnosis not present

## 2023-06-13 DIAGNOSIS — M542 Cervicalgia: Secondary | ICD-10-CM | POA: Diagnosis not present

## 2023-06-13 DIAGNOSIS — M63811 Disorders of muscle in diseases classified elsewhere, right shoulder: Secondary | ICD-10-CM | POA: Diagnosis not present

## 2023-06-13 DIAGNOSIS — M24111 Other articular cartilage disorders, right shoulder: Secondary | ICD-10-CM | POA: Diagnosis not present

## 2023-06-17 NOTE — Progress Notes (Signed)
 [facility] [address] [place] [address] Phone: [phone] Subjective:   IBryan Caprio, am serving as a scribe for Dr. Ronnell Coins.  I'm seeing this patient by the request  of:  Tena Feeling, MD  CC: right shoulder pain   [id number]  04/29/2023 Known arthritic changes noted.  Discussed icing regimen and home exercises, discussed with patient that I do believe that she will continue to respond extremely well to physical therapy and Occupational Therapy.  Will extend so it is also working on her neck as well as the left shoulder.  No significant changes in medication at the moment.  Worsening pain would like to highly recommend the possibility of injections.  Patient would like to hold on that at the moment.  Follow-up with me again in 2 months   Update 06/27/2023 Brooke [name] is a 83 y.o. female coming in with complaint of R shoulder pain. Patient states OT has helped. Would we be able to do an injection today?    Right neck xray shows severe OA   GIVE SHOULDER INJECTION AGAIN  Past Medical History:  Diagnosis Date   Anxiety    Depression    no history of.   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   "Saturday night palsy"   Osteopenia    Rheumatic fever    no cardiology issues   Past Surgical History:  Procedure Laterality Date   CATARACT EXTRACTION Right    COLONOSCOPY WITH PROPOFOL  N/A 01/08/2014   Procedure: COLONOSCOPY WITH PROPOFOL ;  Surgeon: Garrett Kallman, MD;  Location: [facility];  Service: Endoscopy;  Laterality: N/A;   PELVIC LAPAROSCOPY     [name]    TONSILLECTOMY     WRIST FRACTURE SURGERY Left    '11-hardware retained   Social History   Socioeconomic History   Marital status: Divorced    Spouse name: Not on file   Number of children: Not on file   Years of education: Not on  file   Highest education level: Not on file  Occupational History   Not on file  Tobacco Use   Smoking status: Never   Smokeless tobacco: Never  Substance and Sexual Activity   Alcohol use: Yes    Alcohol/week: 5.0 standard drinks of alcohol    Types: 5 Standard drinks or equivalent per week    Comment: 2 wine glasses per day.   Drug use: No   Sexual activity: Yes    Partners: Male    Comment: husband vasectomy  Other Topics Concern   Not on file  Social History Narrative   Not on file   Social Drivers of Health   Financial Resource Strain: Not on file  Food Insecurity: Not on file  Transportation Needs: Not on file  Physical Activity: Not on file  Stress: Not on file  Social Connections: Not on file   Allergies  Allergen Reactions   Latex     Long period ... Red skin    Family History  Problem Relation Age of Onset   Cancer Sister        ductal invasive cancer   Breast cancer Sister 81   Cancer Maternal Grandmother        colon   Cancer Father        lung (  smoker)   Depression Sister    Osteoporosis Sister     Current Outpatient Medications (Endocrine & Metabolic):    denosumab  (PROLIA ) 60 MG/ML SOSY injection, Inject 60 mg into the skin every 6 (six) months.   predniSONE  (DELTASONE ) 20 MG tablet, Take 1 tablet (20 mg total) by mouth daily with breakfast.  Current Outpatient Medications (Cardiovascular):    rosuvastatin  (CRESTOR ) 5 MG tablet, Take 2 tablets (10 mg total) by mouth daily.  Current Outpatient Medications (Respiratory):    guaiFENesin (MUCINEX) 600 MG 12 hr tablet, Take 600 mg by mouth 2 (two) times daily as needed for cough.   loratadine (CLARITIN) 10 MG tablet, Take 10 mg by mouth daily as needed for allergies.  Current Outpatient Medications (Analgesics):    naproxen sodium (ALEVE) 220 MG tablet, Take 220 mg by mouth 2 (two) times daily as needed (for pain).   Current Outpatient Medications (Other):    ALPRAZolam (XANAX) 0.5 MG tablet,  Take 0.5 mg by mouth daily as needed for sleep.    calcium -vitamin D (OSCAL WITH D) 500-200 MG-UNIT tablet, Take 1 tablet by mouth 2 (two) times daily.   Cholecalciferol (VITAMIN D) 50 MCG (2000 UT) tablet, Take 2,000 Units by mouth daily.   clobetasol cream (TEMOVATE) 0.05 %, Apply 1 application topically 2 (two) times daily as needed (for rash).   diclofenac Sodium (VOLTAREN) 1 % GEL, Apply 2 g topically 4 (four) times daily as needed (for pain).   estradiol (ESTRACE) 0.1 MG/GM vaginal cream, Place 1 Applicatorful vaginally 2 (two) times a week.   Kelp 100 MG TABS, Take 100 mg by mouth daily.   melatonin 3 MG TABS tablet, Take 3 mg by mouth at bedtime.   Misc Natural Products (TART CHERRY ADVANCED PO), Take 1 tablet by mouth daily.   Turmeric 500 MG TABS, Take 500 mg by mouth daily.   Reviewed prior external information including notes and imaging from  primary care provider As well as notes that were available from care everywhere and other healthcare systems.  Past medical history, social, surgical and family history all reviewed in electronic medical record.  No pertanent information unless stated regarding to the chief complaint.   Review of Systems:  No headache, visual changes, nausea, vomiting, diarrhea, constipation, dizziness, abdominal pain, skin rash, fevers, chills, night sweats, weight loss, swollen lymph nodes, body aches, joint swelling, chest pain, shortness of breath, mood changes. POSITIVE muscle aches  Objective  Last menstrual period 03/08/1992.   General: No apparent distress alert and oriented x3 mood and affect normal, dressed appropriately.  HEENT: Pupils equal, extraocular movements intact  Respiratory: Patient's speak in full sentences and does not appear short of breath  Cardiovascular: No lower extremity edema, non tender, no erythema  Right shoulder exam shows tenderness to palpation noted.  Some tenderness in all range of motion.  Still no seems to be  better than previous exam.  Positive crossover noted.  Procedure: Real-time Ultrasound Guided Injection of right glenohumeral joint Device: GE Logiq Q7  Ultrasound guided injection is preferred based studies that show increased duration, increased effect, greater accuracy, decreased procedural pain, increased response rate with ultrasound guided versus blind injection.  Verbal informed consent obtained.  Time-out conducted.  Noted no overlying erythema, induration, or other signs of local infection.  Skin prepped in a sterile fashion.  Local anesthesia: Topical Ethyl chloride.  With sterile technique and under real time ultrasound guidance:  Joint visualized.  23g 1  inch needle inserted posterior  approach. Pictures taken for needle placement. Patient did have injection of 2 cc of 0.5% Marcaine, and 1.0 cc of Kenalog  40 mg/dL. Completed without difficulty  Pain immediately resolved suggesting accurate placement of the medication.  Advised to call if fevers/chills, erythema, induration, drainage, or persistent bleeding.  Images saved Impression: Technically successful ultrasound guided injection.  Procedure: Real-time Ultrasound Guided Injection of right acromioclavicular joint Device: GE Logiq Q7 Ultrasound guided injection is preferred based studies that show increased duration, increased effect, greater accuracy, decreased procedural pain, increased response rate, and decreased cost with ultrasound guided versus blind injection.  Verbal informed consent obtained.  Time-out conducted.  Noted no overlying erythema, induration, or other signs of local infection.  Skin prepped in a sterile fashion.  Local anesthesia: Topical Ethyl chloride.  With sterile technique and under real time ultrasound guidance: With a 25-gauge half inch needle injected with 0.5 cc of 0.5% Marcaine and 0.5 cc of Kenalog  40 mg/mL Completed without difficulty  Pain immediately resolved suggesting accurate placement  of the medication.  Advised to call if fevers/chills, erythema, induration, drainage, or persistent bleeding.  Impression: Technically successful ultrasound guided injection.    Impression and Recommendations:    The above documentation has been reviewed and is accurate and complete Mattheus Rauls M Oskar Cretella, DO

## 2023-06-18 DIAGNOSIS — M19012 Primary osteoarthritis, left shoulder: Secondary | ICD-10-CM | POA: Diagnosis not present

## 2023-06-18 DIAGNOSIS — M542 Cervicalgia: Secondary | ICD-10-CM | POA: Diagnosis not present

## 2023-06-18 DIAGNOSIS — M63811 Disorders of muscle in diseases classified elsewhere, right shoulder: Secondary | ICD-10-CM | POA: Diagnosis not present

## 2023-06-18 DIAGNOSIS — M25511 Pain in right shoulder: Secondary | ICD-10-CM | POA: Diagnosis not present

## 2023-06-18 DIAGNOSIS — M25512 Pain in left shoulder: Secondary | ICD-10-CM | POA: Diagnosis not present

## 2023-06-18 DIAGNOSIS — M24111 Other articular cartilage disorders, right shoulder: Secondary | ICD-10-CM | POA: Diagnosis not present

## 2023-06-18 DIAGNOSIS — R278 Other lack of coordination: Secondary | ICD-10-CM | POA: Diagnosis not present

## 2023-06-18 DIAGNOSIS — M19011 Primary osteoarthritis, right shoulder: Secondary | ICD-10-CM | POA: Diagnosis not present

## 2023-06-18 DIAGNOSIS — R29898 Other symptoms and signs involving the musculoskeletal system: Secondary | ICD-10-CM | POA: Diagnosis not present

## 2023-06-20 DIAGNOSIS — M25511 Pain in right shoulder: Secondary | ICD-10-CM | POA: Diagnosis not present

## 2023-06-20 DIAGNOSIS — M19012 Primary osteoarthritis, left shoulder: Secondary | ICD-10-CM | POA: Diagnosis not present

## 2023-06-20 DIAGNOSIS — M19011 Primary osteoarthritis, right shoulder: Secondary | ICD-10-CM | POA: Diagnosis not present

## 2023-06-20 DIAGNOSIS — R29898 Other symptoms and signs involving the musculoskeletal system: Secondary | ICD-10-CM | POA: Diagnosis not present

## 2023-06-20 DIAGNOSIS — R278 Other lack of coordination: Secondary | ICD-10-CM | POA: Diagnosis not present

## 2023-06-20 DIAGNOSIS — M542 Cervicalgia: Secondary | ICD-10-CM | POA: Diagnosis not present

## 2023-06-20 DIAGNOSIS — M25512 Pain in left shoulder: Secondary | ICD-10-CM | POA: Diagnosis not present

## 2023-06-20 DIAGNOSIS — M24111 Other articular cartilage disorders, right shoulder: Secondary | ICD-10-CM | POA: Diagnosis not present

## 2023-06-20 DIAGNOSIS — M63811 Disorders of muscle in diseases classified elsewhere, right shoulder: Secondary | ICD-10-CM | POA: Diagnosis not present

## 2023-06-22 DIAGNOSIS — R29898 Other symptoms and signs involving the musculoskeletal system: Secondary | ICD-10-CM | POA: Diagnosis not present

## 2023-06-22 DIAGNOSIS — M25512 Pain in left shoulder: Secondary | ICD-10-CM | POA: Diagnosis not present

## 2023-06-22 DIAGNOSIS — M63811 Disorders of muscle in diseases classified elsewhere, right shoulder: Secondary | ICD-10-CM | POA: Diagnosis not present

## 2023-06-22 DIAGNOSIS — M19012 Primary osteoarthritis, left shoulder: Secondary | ICD-10-CM | POA: Diagnosis not present

## 2023-06-22 DIAGNOSIS — M542 Cervicalgia: Secondary | ICD-10-CM | POA: Diagnosis not present

## 2023-06-22 DIAGNOSIS — R278 Other lack of coordination: Secondary | ICD-10-CM | POA: Diagnosis not present

## 2023-06-22 DIAGNOSIS — M25511 Pain in right shoulder: Secondary | ICD-10-CM | POA: Diagnosis not present

## 2023-06-22 DIAGNOSIS — M19011 Primary osteoarthritis, right shoulder: Secondary | ICD-10-CM | POA: Diagnosis not present

## 2023-06-22 DIAGNOSIS — M24111 Other articular cartilage disorders, right shoulder: Secondary | ICD-10-CM | POA: Diagnosis not present

## 2023-06-24 DIAGNOSIS — M542 Cervicalgia: Secondary | ICD-10-CM | POA: Diagnosis not present

## 2023-06-24 DIAGNOSIS — M24111 Other articular cartilage disorders, right shoulder: Secondary | ICD-10-CM | POA: Diagnosis not present

## 2023-06-24 DIAGNOSIS — M19012 Primary osteoarthritis, left shoulder: Secondary | ICD-10-CM | POA: Diagnosis not present

## 2023-06-24 DIAGNOSIS — M19011 Primary osteoarthritis, right shoulder: Secondary | ICD-10-CM | POA: Diagnosis not present

## 2023-06-24 DIAGNOSIS — M63811 Disorders of muscle in diseases classified elsewhere, right shoulder: Secondary | ICD-10-CM | POA: Diagnosis not present

## 2023-06-24 DIAGNOSIS — M25511 Pain in right shoulder: Secondary | ICD-10-CM | POA: Diagnosis not present

## 2023-06-24 DIAGNOSIS — M25512 Pain in left shoulder: Secondary | ICD-10-CM | POA: Diagnosis not present

## 2023-06-24 DIAGNOSIS — R278 Other lack of coordination: Secondary | ICD-10-CM | POA: Diagnosis not present

## 2023-06-24 DIAGNOSIS — R29898 Other symptoms and signs involving the musculoskeletal system: Secondary | ICD-10-CM | POA: Diagnosis not present

## 2023-06-27 ENCOUNTER — Encounter: Payer: Self-pay | Admitting: Family Medicine

## 2023-06-27 ENCOUNTER — Other Ambulatory Visit: Payer: Self-pay

## 2023-06-27 ENCOUNTER — Ambulatory Visit: Payer: HMO | Admitting: Family Medicine

## 2023-06-27 VITALS — BP 110/84 | HR 83 | Ht 65.0 in

## 2023-06-27 DIAGNOSIS — R29898 Other symptoms and signs involving the musculoskeletal system: Secondary | ICD-10-CM | POA: Diagnosis not present

## 2023-06-27 DIAGNOSIS — M542 Cervicalgia: Secondary | ICD-10-CM | POA: Diagnosis not present

## 2023-06-27 DIAGNOSIS — R278 Other lack of coordination: Secondary | ICD-10-CM | POA: Diagnosis not present

## 2023-06-27 DIAGNOSIS — M63811 Disorders of muscle in diseases classified elsewhere, right shoulder: Secondary | ICD-10-CM | POA: Diagnosis not present

## 2023-06-27 DIAGNOSIS — M19011 Primary osteoarthritis, right shoulder: Secondary | ICD-10-CM

## 2023-06-27 DIAGNOSIS — M19012 Primary osteoarthritis, left shoulder: Secondary | ICD-10-CM | POA: Diagnosis not present

## 2023-06-27 DIAGNOSIS — M25512 Pain in left shoulder: Secondary | ICD-10-CM | POA: Diagnosis not present

## 2023-06-27 DIAGNOSIS — M25511 Pain in right shoulder: Secondary | ICD-10-CM | POA: Diagnosis not present

## 2023-06-27 DIAGNOSIS — M24111 Other articular cartilage disorders, right shoulder: Secondary | ICD-10-CM | POA: Diagnosis not present

## 2023-06-27 NOTE — Assessment & Plan Note (Signed)
 Chronic problem with exacerbation.  Discussed icing regimen and home exercises, which activities to do and which ones to avoid.  Increase activity slowly.  Discussed icing regimen.  Follow-up with me again in 6 to 8 weeks

## 2023-06-27 NOTE — Patient Instructions (Addendum)
 Injections in shoulder today Good to see you! See you again in 2-3 months

## 2023-06-27 NOTE — Assessment & Plan Note (Signed)
 Repeat injection given today and tolerated the procedure well, discussed icing regimen and home exercises, discussed which activities to do and which ones to avoid.  Increase activity slowly.  Follow-up again in 6 to 8 weeks

## 2023-06-29 DIAGNOSIS — M19012 Primary osteoarthritis, left shoulder: Secondary | ICD-10-CM | POA: Diagnosis not present

## 2023-06-29 DIAGNOSIS — R29898 Other symptoms and signs involving the musculoskeletal system: Secondary | ICD-10-CM | POA: Diagnosis not present

## 2023-06-29 DIAGNOSIS — M19011 Primary osteoarthritis, right shoulder: Secondary | ICD-10-CM | POA: Diagnosis not present

## 2023-06-29 DIAGNOSIS — M25512 Pain in left shoulder: Secondary | ICD-10-CM | POA: Diagnosis not present

## 2023-06-29 DIAGNOSIS — M63811 Disorders of muscle in diseases classified elsewhere, right shoulder: Secondary | ICD-10-CM | POA: Diagnosis not present

## 2023-06-29 DIAGNOSIS — M542 Cervicalgia: Secondary | ICD-10-CM | POA: Diagnosis not present

## 2023-06-29 DIAGNOSIS — R278 Other lack of coordination: Secondary | ICD-10-CM | POA: Diagnosis not present

## 2023-06-29 DIAGNOSIS — M24111 Other articular cartilage disorders, right shoulder: Secondary | ICD-10-CM | POA: Diagnosis not present

## 2023-06-29 DIAGNOSIS — M25511 Pain in right shoulder: Secondary | ICD-10-CM | POA: Diagnosis not present

## 2023-07-01 DIAGNOSIS — M25511 Pain in right shoulder: Secondary | ICD-10-CM | POA: Diagnosis not present

## 2023-07-01 DIAGNOSIS — R29898 Other symptoms and signs involving the musculoskeletal system: Secondary | ICD-10-CM | POA: Diagnosis not present

## 2023-07-01 DIAGNOSIS — M19012 Primary osteoarthritis, left shoulder: Secondary | ICD-10-CM | POA: Diagnosis not present

## 2023-07-01 DIAGNOSIS — R278 Other lack of coordination: Secondary | ICD-10-CM | POA: Diagnosis not present

## 2023-07-01 DIAGNOSIS — M19011 Primary osteoarthritis, right shoulder: Secondary | ICD-10-CM | POA: Diagnosis not present

## 2023-07-01 DIAGNOSIS — M24111 Other articular cartilage disorders, right shoulder: Secondary | ICD-10-CM | POA: Diagnosis not present

## 2023-07-01 DIAGNOSIS — M63811 Disorders of muscle in diseases classified elsewhere, right shoulder: Secondary | ICD-10-CM | POA: Diagnosis not present

## 2023-07-01 DIAGNOSIS — M25512 Pain in left shoulder: Secondary | ICD-10-CM | POA: Diagnosis not present

## 2023-07-01 DIAGNOSIS — M542 Cervicalgia: Secondary | ICD-10-CM | POA: Diagnosis not present

## 2023-07-15 ENCOUNTER — Encounter (HOSPITAL_COMMUNITY): Payer: Self-pay

## 2023-07-18 DIAGNOSIS — R278 Other lack of coordination: Secondary | ICD-10-CM | POA: Diagnosis not present

## 2023-07-18 DIAGNOSIS — M19011 Primary osteoarthritis, right shoulder: Secondary | ICD-10-CM | POA: Diagnosis not present

## 2023-07-18 DIAGNOSIS — M542 Cervicalgia: Secondary | ICD-10-CM | POA: Diagnosis not present

## 2023-07-18 DIAGNOSIS — M25512 Pain in left shoulder: Secondary | ICD-10-CM | POA: Diagnosis not present

## 2023-07-18 DIAGNOSIS — M19012 Primary osteoarthritis, left shoulder: Secondary | ICD-10-CM | POA: Diagnosis not present

## 2023-07-18 DIAGNOSIS — M63811 Disorders of muscle in diseases classified elsewhere, right shoulder: Secondary | ICD-10-CM | POA: Diagnosis not present

## 2023-07-18 DIAGNOSIS — M24111 Other articular cartilage disorders, right shoulder: Secondary | ICD-10-CM | POA: Diagnosis not present

## 2023-07-18 DIAGNOSIS — M25511 Pain in right shoulder: Secondary | ICD-10-CM | POA: Diagnosis not present

## 2023-07-18 DIAGNOSIS — R29898 Other symptoms and signs involving the musculoskeletal system: Secondary | ICD-10-CM | POA: Diagnosis not present

## 2023-07-20 DIAGNOSIS — R29898 Other symptoms and signs involving the musculoskeletal system: Secondary | ICD-10-CM | POA: Diagnosis not present

## 2023-07-20 DIAGNOSIS — M25512 Pain in left shoulder: Secondary | ICD-10-CM | POA: Diagnosis not present

## 2023-07-20 DIAGNOSIS — M63811 Disorders of muscle in diseases classified elsewhere, right shoulder: Secondary | ICD-10-CM | POA: Diagnosis not present

## 2023-07-20 DIAGNOSIS — M25511 Pain in right shoulder: Secondary | ICD-10-CM | POA: Diagnosis not present

## 2023-07-20 DIAGNOSIS — M542 Cervicalgia: Secondary | ICD-10-CM | POA: Diagnosis not present

## 2023-07-20 DIAGNOSIS — M19012 Primary osteoarthritis, left shoulder: Secondary | ICD-10-CM | POA: Diagnosis not present

## 2023-07-20 DIAGNOSIS — M19011 Primary osteoarthritis, right shoulder: Secondary | ICD-10-CM | POA: Diagnosis not present

## 2023-07-20 DIAGNOSIS — M24111 Other articular cartilage disorders, right shoulder: Secondary | ICD-10-CM | POA: Diagnosis not present

## 2023-07-20 DIAGNOSIS — R278 Other lack of coordination: Secondary | ICD-10-CM | POA: Diagnosis not present

## 2023-07-22 ENCOUNTER — Ambulatory Visit
Admission: RE | Admit: 2023-07-22 | Discharge: 2023-07-22 | Disposition: A | Discharge status: Home / Self Care | Payer: Self-pay | Source: Ambulatory Visit | Attending: Internal Medicine | Admitting: Internal Medicine

## 2023-07-22 DIAGNOSIS — Z1231 Encounter for screening mammogram for malignant neoplasm of breast: Secondary | ICD-10-CM | POA: Diagnosis not present

## 2023-07-25 DIAGNOSIS — M63811 Disorders of muscle in diseases classified elsewhere, right shoulder: Secondary | ICD-10-CM | POA: Diagnosis not present

## 2023-07-25 DIAGNOSIS — R278 Other lack of coordination: Secondary | ICD-10-CM | POA: Diagnosis not present

## 2023-07-25 DIAGNOSIS — M542 Cervicalgia: Secondary | ICD-10-CM | POA: Diagnosis not present

## 2023-07-25 DIAGNOSIS — M25512 Pain in left shoulder: Secondary | ICD-10-CM | POA: Diagnosis not present

## 2023-07-25 DIAGNOSIS — M19012 Primary osteoarthritis, left shoulder: Secondary | ICD-10-CM | POA: Diagnosis not present

## 2023-07-25 DIAGNOSIS — M19011 Primary osteoarthritis, right shoulder: Secondary | ICD-10-CM | POA: Diagnosis not present

## 2023-07-25 DIAGNOSIS — M25511 Pain in right shoulder: Secondary | ICD-10-CM | POA: Diagnosis not present

## 2023-07-25 DIAGNOSIS — R29898 Other symptoms and signs involving the musculoskeletal system: Secondary | ICD-10-CM | POA: Diagnosis not present

## 2023-07-25 DIAGNOSIS — M24111 Other articular cartilage disorders, right shoulder: Secondary | ICD-10-CM | POA: Diagnosis not present

## 2023-07-27 DIAGNOSIS — M19011 Primary osteoarthritis, right shoulder: Secondary | ICD-10-CM | POA: Diagnosis not present

## 2023-07-27 DIAGNOSIS — M25512 Pain in left shoulder: Secondary | ICD-10-CM | POA: Diagnosis not present

## 2023-07-27 DIAGNOSIS — M19012 Primary osteoarthritis, left shoulder: Secondary | ICD-10-CM | POA: Diagnosis not present

## 2023-07-27 DIAGNOSIS — M25511 Pain in right shoulder: Secondary | ICD-10-CM | POA: Diagnosis not present

## 2023-07-27 DIAGNOSIS — M24111 Other articular cartilage disorders, right shoulder: Secondary | ICD-10-CM | POA: Diagnosis not present

## 2023-07-27 DIAGNOSIS — R29898 Other symptoms and signs involving the musculoskeletal system: Secondary | ICD-10-CM | POA: Diagnosis not present

## 2023-07-27 DIAGNOSIS — R278 Other lack of coordination: Secondary | ICD-10-CM | POA: Diagnosis not present

## 2023-07-27 DIAGNOSIS — M542 Cervicalgia: Secondary | ICD-10-CM | POA: Diagnosis not present

## 2023-07-27 DIAGNOSIS — M63811 Disorders of muscle in diseases classified elsewhere, right shoulder: Secondary | ICD-10-CM | POA: Diagnosis not present

## 2023-07-29 DIAGNOSIS — M25511 Pain in right shoulder: Secondary | ICD-10-CM | POA: Diagnosis not present

## 2023-07-29 DIAGNOSIS — M19012 Primary osteoarthritis, left shoulder: Secondary | ICD-10-CM | POA: Diagnosis not present

## 2023-07-29 DIAGNOSIS — M24111 Other articular cartilage disorders, right shoulder: Secondary | ICD-10-CM | POA: Diagnosis not present

## 2023-07-29 DIAGNOSIS — M19011 Primary osteoarthritis, right shoulder: Secondary | ICD-10-CM | POA: Diagnosis not present

## 2023-07-29 DIAGNOSIS — M63811 Disorders of muscle in diseases classified elsewhere, right shoulder: Secondary | ICD-10-CM | POA: Diagnosis not present

## 2023-07-29 DIAGNOSIS — M542 Cervicalgia: Secondary | ICD-10-CM | POA: Diagnosis not present

## 2023-07-29 DIAGNOSIS — R278 Other lack of coordination: Secondary | ICD-10-CM | POA: Diagnosis not present

## 2023-07-29 DIAGNOSIS — R29898 Other symptoms and signs involving the musculoskeletal system: Secondary | ICD-10-CM | POA: Diagnosis not present

## 2023-07-29 DIAGNOSIS — M25512 Pain in left shoulder: Secondary | ICD-10-CM | POA: Diagnosis not present

## 2023-08-03 DIAGNOSIS — M19011 Primary osteoarthritis, right shoulder: Secondary | ICD-10-CM | POA: Diagnosis not present

## 2023-08-03 DIAGNOSIS — M19012 Primary osteoarthritis, left shoulder: Secondary | ICD-10-CM | POA: Diagnosis not present

## 2023-08-03 DIAGNOSIS — M63811 Disorders of muscle in diseases classified elsewhere, right shoulder: Secondary | ICD-10-CM | POA: Diagnosis not present

## 2023-08-03 DIAGNOSIS — R278 Other lack of coordination: Secondary | ICD-10-CM | POA: Diagnosis not present

## 2023-08-03 DIAGNOSIS — R29898 Other symptoms and signs involving the musculoskeletal system: Secondary | ICD-10-CM | POA: Diagnosis not present

## 2023-08-03 DIAGNOSIS — M25512 Pain in left shoulder: Secondary | ICD-10-CM | POA: Diagnosis not present

## 2023-08-03 DIAGNOSIS — M24111 Other articular cartilage disorders, right shoulder: Secondary | ICD-10-CM | POA: Diagnosis not present

## 2023-08-03 DIAGNOSIS — M542 Cervicalgia: Secondary | ICD-10-CM | POA: Diagnosis not present

## 2023-08-03 DIAGNOSIS — M25511 Pain in right shoulder: Secondary | ICD-10-CM | POA: Diagnosis not present

## 2023-08-15 DIAGNOSIS — M25512 Pain in left shoulder: Secondary | ICD-10-CM | POA: Diagnosis not present

## 2023-08-15 DIAGNOSIS — M24111 Other articular cartilage disorders, right shoulder: Secondary | ICD-10-CM | POA: Diagnosis not present

## 2023-08-15 DIAGNOSIS — M63811 Disorders of muscle in diseases classified elsewhere, right shoulder: Secondary | ICD-10-CM | POA: Diagnosis not present

## 2023-08-15 DIAGNOSIS — M25511 Pain in right shoulder: Secondary | ICD-10-CM | POA: Diagnosis not present

## 2023-08-15 DIAGNOSIS — M19012 Primary osteoarthritis, left shoulder: Secondary | ICD-10-CM | POA: Diagnosis not present

## 2023-08-15 DIAGNOSIS — R278 Other lack of coordination: Secondary | ICD-10-CM | POA: Diagnosis not present

## 2023-08-15 DIAGNOSIS — M542 Cervicalgia: Secondary | ICD-10-CM | POA: Diagnosis not present

## 2023-08-15 DIAGNOSIS — M19011 Primary osteoarthritis, right shoulder: Secondary | ICD-10-CM | POA: Diagnosis not present

## 2023-08-15 DIAGNOSIS — R29898 Other symptoms and signs involving the musculoskeletal system: Secondary | ICD-10-CM | POA: Diagnosis not present

## 2023-08-19 DIAGNOSIS — M25511 Pain in right shoulder: Secondary | ICD-10-CM | POA: Diagnosis not present

## 2023-08-19 DIAGNOSIS — R278 Other lack of coordination: Secondary | ICD-10-CM | POA: Diagnosis not present

## 2023-08-19 DIAGNOSIS — M19012 Primary osteoarthritis, left shoulder: Secondary | ICD-10-CM | POA: Diagnosis not present

## 2023-08-19 DIAGNOSIS — M542 Cervicalgia: Secondary | ICD-10-CM | POA: Diagnosis not present

## 2023-08-19 DIAGNOSIS — M63811 Disorders of muscle in diseases classified elsewhere, right shoulder: Secondary | ICD-10-CM | POA: Diagnosis not present

## 2023-08-19 DIAGNOSIS — M19011 Primary osteoarthritis, right shoulder: Secondary | ICD-10-CM | POA: Diagnosis not present

## 2023-08-19 DIAGNOSIS — M24111 Other articular cartilage disorders, right shoulder: Secondary | ICD-10-CM | POA: Diagnosis not present

## 2023-08-19 DIAGNOSIS — M25512 Pain in left shoulder: Secondary | ICD-10-CM | POA: Diagnosis not present

## 2023-08-19 DIAGNOSIS — R29898 Other symptoms and signs involving the musculoskeletal system: Secondary | ICD-10-CM | POA: Diagnosis not present

## 2023-08-22 ENCOUNTER — Ambulatory Visit: Admitting: Family Medicine

## 2023-08-24 DIAGNOSIS — M19011 Primary osteoarthritis, right shoulder: Secondary | ICD-10-CM | POA: Diagnosis not present

## 2023-08-24 DIAGNOSIS — M63811 Disorders of muscle in diseases classified elsewhere, right shoulder: Secondary | ICD-10-CM | POA: Diagnosis not present

## 2023-08-24 DIAGNOSIS — M19012 Primary osteoarthritis, left shoulder: Secondary | ICD-10-CM | POA: Diagnosis not present

## 2023-08-24 DIAGNOSIS — R278 Other lack of coordination: Secondary | ICD-10-CM | POA: Diagnosis not present

## 2023-08-24 DIAGNOSIS — M24111 Other articular cartilage disorders, right shoulder: Secondary | ICD-10-CM | POA: Diagnosis not present

## 2023-08-24 DIAGNOSIS — M25512 Pain in left shoulder: Secondary | ICD-10-CM | POA: Diagnosis not present

## 2023-08-24 DIAGNOSIS — R29898 Other symptoms and signs involving the musculoskeletal system: Secondary | ICD-10-CM | POA: Diagnosis not present

## 2023-08-24 DIAGNOSIS — M25511 Pain in right shoulder: Secondary | ICD-10-CM | POA: Diagnosis not present

## 2023-08-24 DIAGNOSIS — M542 Cervicalgia: Secondary | ICD-10-CM | POA: Diagnosis not present

## 2023-08-26 DIAGNOSIS — M63811 Disorders of muscle in diseases classified elsewhere, right shoulder: Secondary | ICD-10-CM | POA: Diagnosis not present

## 2023-08-26 DIAGNOSIS — M24111 Other articular cartilage disorders, right shoulder: Secondary | ICD-10-CM | POA: Diagnosis not present

## 2023-08-26 DIAGNOSIS — M25512 Pain in left shoulder: Secondary | ICD-10-CM | POA: Diagnosis not present

## 2023-08-26 DIAGNOSIS — M19011 Primary osteoarthritis, right shoulder: Secondary | ICD-10-CM | POA: Diagnosis not present

## 2023-08-26 DIAGNOSIS — R278 Other lack of coordination: Secondary | ICD-10-CM | POA: Diagnosis not present

## 2023-08-26 DIAGNOSIS — M19012 Primary osteoarthritis, left shoulder: Secondary | ICD-10-CM | POA: Diagnosis not present

## 2023-08-26 DIAGNOSIS — R29898 Other symptoms and signs involving the musculoskeletal system: Secondary | ICD-10-CM | POA: Diagnosis not present

## 2023-08-26 DIAGNOSIS — M25511 Pain in right shoulder: Secondary | ICD-10-CM | POA: Diagnosis not present

## 2023-08-26 DIAGNOSIS — M542 Cervicalgia: Secondary | ICD-10-CM | POA: Diagnosis not present

## 2023-08-29 DIAGNOSIS — M25512 Pain in left shoulder: Secondary | ICD-10-CM | POA: Diagnosis not present

## 2023-08-29 DIAGNOSIS — M542 Cervicalgia: Secondary | ICD-10-CM | POA: Diagnosis not present

## 2023-08-29 DIAGNOSIS — M24111 Other articular cartilage disorders, right shoulder: Secondary | ICD-10-CM | POA: Diagnosis not present

## 2023-08-29 DIAGNOSIS — M63811 Disorders of muscle in diseases classified elsewhere, right shoulder: Secondary | ICD-10-CM | POA: Diagnosis not present

## 2023-08-29 DIAGNOSIS — M19012 Primary osteoarthritis, left shoulder: Secondary | ICD-10-CM | POA: Diagnosis not present

## 2023-08-29 DIAGNOSIS — R29898 Other symptoms and signs involving the musculoskeletal system: Secondary | ICD-10-CM | POA: Diagnosis not present

## 2023-08-29 DIAGNOSIS — M25511 Pain in right shoulder: Secondary | ICD-10-CM | POA: Diagnosis not present

## 2023-08-29 DIAGNOSIS — R278 Other lack of coordination: Secondary | ICD-10-CM | POA: Diagnosis not present

## 2023-08-29 DIAGNOSIS — M19011 Primary osteoarthritis, right shoulder: Secondary | ICD-10-CM | POA: Diagnosis not present

## 2023-09-05 DIAGNOSIS — M19012 Primary osteoarthritis, left shoulder: Secondary | ICD-10-CM | POA: Diagnosis not present

## 2023-09-05 DIAGNOSIS — R29898 Other symptoms and signs involving the musculoskeletal system: Secondary | ICD-10-CM | POA: Diagnosis not present

## 2023-09-05 DIAGNOSIS — M542 Cervicalgia: Secondary | ICD-10-CM | POA: Diagnosis not present

## 2023-09-05 DIAGNOSIS — M24111 Other articular cartilage disorders, right shoulder: Secondary | ICD-10-CM | POA: Diagnosis not present

## 2023-09-05 DIAGNOSIS — M63811 Disorders of muscle in diseases classified elsewhere, right shoulder: Secondary | ICD-10-CM | POA: Diagnosis not present

## 2023-09-05 DIAGNOSIS — M25511 Pain in right shoulder: Secondary | ICD-10-CM | POA: Diagnosis not present

## 2023-09-05 DIAGNOSIS — R278 Other lack of coordination: Secondary | ICD-10-CM | POA: Diagnosis not present

## 2023-09-05 DIAGNOSIS — M25512 Pain in left shoulder: Secondary | ICD-10-CM | POA: Diagnosis not present

## 2023-09-05 DIAGNOSIS — M19011 Primary osteoarthritis, right shoulder: Secondary | ICD-10-CM | POA: Diagnosis not present

## 2023-09-07 DIAGNOSIS — M25512 Pain in left shoulder: Secondary | ICD-10-CM | POA: Diagnosis not present

## 2023-09-07 DIAGNOSIS — M542 Cervicalgia: Secondary | ICD-10-CM | POA: Diagnosis not present

## 2023-09-07 DIAGNOSIS — R29898 Other symptoms and signs involving the musculoskeletal system: Secondary | ICD-10-CM | POA: Diagnosis not present

## 2023-09-07 DIAGNOSIS — R278 Other lack of coordination: Secondary | ICD-10-CM | POA: Diagnosis not present

## 2023-09-07 DIAGNOSIS — M63811 Disorders of muscle in diseases classified elsewhere, right shoulder: Secondary | ICD-10-CM | POA: Diagnosis not present

## 2023-09-07 DIAGNOSIS — M19012 Primary osteoarthritis, left shoulder: Secondary | ICD-10-CM | POA: Diagnosis not present

## 2023-09-07 DIAGNOSIS — M24111 Other articular cartilage disorders, right shoulder: Secondary | ICD-10-CM | POA: Diagnosis not present

## 2023-09-07 DIAGNOSIS — M25511 Pain in right shoulder: Secondary | ICD-10-CM | POA: Diagnosis not present

## 2023-09-07 DIAGNOSIS — M19011 Primary osteoarthritis, right shoulder: Secondary | ICD-10-CM | POA: Diagnosis not present

## 2023-09-12 DIAGNOSIS — M63811 Disorders of muscle in diseases classified elsewhere, right shoulder: Secondary | ICD-10-CM | POA: Diagnosis not present

## 2023-09-12 DIAGNOSIS — M25512 Pain in left shoulder: Secondary | ICD-10-CM | POA: Diagnosis not present

## 2023-09-12 DIAGNOSIS — R278 Other lack of coordination: Secondary | ICD-10-CM | POA: Diagnosis not present

## 2023-09-12 DIAGNOSIS — M25511 Pain in right shoulder: Secondary | ICD-10-CM | POA: Diagnosis not present

## 2023-09-12 DIAGNOSIS — M19011 Primary osteoarthritis, right shoulder: Secondary | ICD-10-CM | POA: Diagnosis not present

## 2023-09-12 DIAGNOSIS — M542 Cervicalgia: Secondary | ICD-10-CM | POA: Diagnosis not present

## 2023-09-12 DIAGNOSIS — R29898 Other symptoms and signs involving the musculoskeletal system: Secondary | ICD-10-CM | POA: Diagnosis not present

## 2023-09-12 DIAGNOSIS — M24111 Other articular cartilage disorders, right shoulder: Secondary | ICD-10-CM | POA: Diagnosis not present

## 2023-09-12 DIAGNOSIS — M19012 Primary osteoarthritis, left shoulder: Secondary | ICD-10-CM | POA: Diagnosis not present

## 2023-09-14 DIAGNOSIS — R278 Other lack of coordination: Secondary | ICD-10-CM | POA: Diagnosis not present

## 2023-09-14 DIAGNOSIS — M19012 Primary osteoarthritis, left shoulder: Secondary | ICD-10-CM | POA: Diagnosis not present

## 2023-09-14 DIAGNOSIS — M542 Cervicalgia: Secondary | ICD-10-CM | POA: Diagnosis not present

## 2023-09-14 DIAGNOSIS — M24111 Other articular cartilage disorders, right shoulder: Secondary | ICD-10-CM | POA: Diagnosis not present

## 2023-09-14 DIAGNOSIS — R29898 Other symptoms and signs involving the musculoskeletal system: Secondary | ICD-10-CM | POA: Diagnosis not present

## 2023-09-14 DIAGNOSIS — M19011 Primary osteoarthritis, right shoulder: Secondary | ICD-10-CM | POA: Diagnosis not present

## 2023-09-14 DIAGNOSIS — M25511 Pain in right shoulder: Secondary | ICD-10-CM | POA: Diagnosis not present

## 2023-09-14 DIAGNOSIS — M63811 Disorders of muscle in diseases classified elsewhere, right shoulder: Secondary | ICD-10-CM | POA: Diagnosis not present

## 2023-09-14 DIAGNOSIS — M25512 Pain in left shoulder: Secondary | ICD-10-CM | POA: Diagnosis not present

## 2023-09-16 DIAGNOSIS — R29898 Other symptoms and signs involving the musculoskeletal system: Secondary | ICD-10-CM | POA: Diagnosis not present

## 2023-09-16 DIAGNOSIS — M542 Cervicalgia: Secondary | ICD-10-CM | POA: Diagnosis not present

## 2023-09-16 DIAGNOSIS — M19011 Primary osteoarthritis, right shoulder: Secondary | ICD-10-CM | POA: Diagnosis not present

## 2023-09-16 DIAGNOSIS — R278 Other lack of coordination: Secondary | ICD-10-CM | POA: Diagnosis not present

## 2023-09-16 DIAGNOSIS — M25512 Pain in left shoulder: Secondary | ICD-10-CM | POA: Diagnosis not present

## 2023-09-16 DIAGNOSIS — M25511 Pain in right shoulder: Secondary | ICD-10-CM | POA: Diagnosis not present

## 2023-09-16 DIAGNOSIS — M19012 Primary osteoarthritis, left shoulder: Secondary | ICD-10-CM | POA: Diagnosis not present

## 2023-09-16 DIAGNOSIS — M24111 Other articular cartilage disorders, right shoulder: Secondary | ICD-10-CM | POA: Diagnosis not present

## 2023-09-16 DIAGNOSIS — M63811 Disorders of muscle in diseases classified elsewhere, right shoulder: Secondary | ICD-10-CM | POA: Diagnosis not present

## 2023-09-19 DIAGNOSIS — M63811 Disorders of muscle in diseases classified elsewhere, right shoulder: Secondary | ICD-10-CM | POA: Diagnosis not present

## 2023-09-19 DIAGNOSIS — M25512 Pain in left shoulder: Secondary | ICD-10-CM | POA: Diagnosis not present

## 2023-09-19 DIAGNOSIS — M19012 Primary osteoarthritis, left shoulder: Secondary | ICD-10-CM | POA: Diagnosis not present

## 2023-09-19 DIAGNOSIS — M24111 Other articular cartilage disorders, right shoulder: Secondary | ICD-10-CM | POA: Diagnosis not present

## 2023-09-19 DIAGNOSIS — M19011 Primary osteoarthritis, right shoulder: Secondary | ICD-10-CM | POA: Diagnosis not present

## 2023-09-19 DIAGNOSIS — M25511 Pain in right shoulder: Secondary | ICD-10-CM | POA: Diagnosis not present

## 2023-09-19 DIAGNOSIS — R278 Other lack of coordination: Secondary | ICD-10-CM | POA: Diagnosis not present

## 2023-09-19 DIAGNOSIS — M542 Cervicalgia: Secondary | ICD-10-CM | POA: Diagnosis not present

## 2023-09-19 DIAGNOSIS — R29898 Other symptoms and signs involving the musculoskeletal system: Secondary | ICD-10-CM | POA: Diagnosis not present

## 2023-09-21 DIAGNOSIS — M19011 Primary osteoarthritis, right shoulder: Secondary | ICD-10-CM | POA: Diagnosis not present

## 2023-09-21 DIAGNOSIS — M24111 Other articular cartilage disorders, right shoulder: Secondary | ICD-10-CM | POA: Diagnosis not present

## 2023-09-21 DIAGNOSIS — R29898 Other symptoms and signs involving the musculoskeletal system: Secondary | ICD-10-CM | POA: Diagnosis not present

## 2023-09-21 DIAGNOSIS — M19012 Primary osteoarthritis, left shoulder: Secondary | ICD-10-CM | POA: Diagnosis not present

## 2023-09-21 DIAGNOSIS — R278 Other lack of coordination: Secondary | ICD-10-CM | POA: Diagnosis not present

## 2023-09-21 DIAGNOSIS — M25512 Pain in left shoulder: Secondary | ICD-10-CM | POA: Diagnosis not present

## 2023-09-21 DIAGNOSIS — M542 Cervicalgia: Secondary | ICD-10-CM | POA: Diagnosis not present

## 2023-09-21 DIAGNOSIS — M25511 Pain in right shoulder: Secondary | ICD-10-CM | POA: Diagnosis not present

## 2023-09-21 DIAGNOSIS — M63811 Disorders of muscle in diseases classified elsewhere, right shoulder: Secondary | ICD-10-CM | POA: Diagnosis not present

## 2023-09-23 DIAGNOSIS — R29898 Other symptoms and signs involving the musculoskeletal system: Secondary | ICD-10-CM | POA: Diagnosis not present

## 2023-09-23 DIAGNOSIS — M19011 Primary osteoarthritis, right shoulder: Secondary | ICD-10-CM | POA: Diagnosis not present

## 2023-09-23 DIAGNOSIS — M24111 Other articular cartilage disorders, right shoulder: Secondary | ICD-10-CM | POA: Diagnosis not present

## 2023-09-23 DIAGNOSIS — M19012 Primary osteoarthritis, left shoulder: Secondary | ICD-10-CM | POA: Diagnosis not present

## 2023-09-23 DIAGNOSIS — R278 Other lack of coordination: Secondary | ICD-10-CM | POA: Diagnosis not present

## 2023-09-23 DIAGNOSIS — M542 Cervicalgia: Secondary | ICD-10-CM | POA: Diagnosis not present

## 2023-09-23 DIAGNOSIS — M25512 Pain in left shoulder: Secondary | ICD-10-CM | POA: Diagnosis not present

## 2023-09-23 DIAGNOSIS — M25511 Pain in right shoulder: Secondary | ICD-10-CM | POA: Diagnosis not present

## 2023-09-23 DIAGNOSIS — M63811 Disorders of muscle in diseases classified elsewhere, right shoulder: Secondary | ICD-10-CM | POA: Diagnosis not present

## 2023-09-26 DIAGNOSIS — M25511 Pain in right shoulder: Secondary | ICD-10-CM | POA: Diagnosis not present

## 2023-09-26 DIAGNOSIS — M542 Cervicalgia: Secondary | ICD-10-CM | POA: Diagnosis not present

## 2023-09-26 DIAGNOSIS — R278 Other lack of coordination: Secondary | ICD-10-CM | POA: Diagnosis not present

## 2023-09-26 DIAGNOSIS — M19011 Primary osteoarthritis, right shoulder: Secondary | ICD-10-CM | POA: Diagnosis not present

## 2023-09-26 DIAGNOSIS — R29898 Other symptoms and signs involving the musculoskeletal system: Secondary | ICD-10-CM | POA: Diagnosis not present

## 2023-09-26 DIAGNOSIS — M19012 Primary osteoarthritis, left shoulder: Secondary | ICD-10-CM | POA: Diagnosis not present

## 2023-09-26 DIAGNOSIS — M25512 Pain in left shoulder: Secondary | ICD-10-CM | POA: Diagnosis not present

## 2023-09-26 DIAGNOSIS — M63811 Disorders of muscle in diseases classified elsewhere, right shoulder: Secondary | ICD-10-CM | POA: Diagnosis not present

## 2023-09-26 DIAGNOSIS — M24111 Other articular cartilage disorders, right shoulder: Secondary | ICD-10-CM | POA: Diagnosis not present

## 2023-09-30 DIAGNOSIS — M19011 Primary osteoarthritis, right shoulder: Secondary | ICD-10-CM | POA: Diagnosis not present

## 2023-09-30 DIAGNOSIS — R278 Other lack of coordination: Secondary | ICD-10-CM | POA: Diagnosis not present

## 2023-09-30 DIAGNOSIS — M542 Cervicalgia: Secondary | ICD-10-CM | POA: Diagnosis not present

## 2023-09-30 DIAGNOSIS — M25511 Pain in right shoulder: Secondary | ICD-10-CM | POA: Diagnosis not present

## 2023-09-30 DIAGNOSIS — M19012 Primary osteoarthritis, left shoulder: Secondary | ICD-10-CM | POA: Diagnosis not present

## 2023-09-30 DIAGNOSIS — M63811 Disorders of muscle in diseases classified elsewhere, right shoulder: Secondary | ICD-10-CM | POA: Diagnosis not present

## 2023-09-30 DIAGNOSIS — M25512 Pain in left shoulder: Secondary | ICD-10-CM | POA: Diagnosis not present

## 2023-09-30 DIAGNOSIS — R29898 Other symptoms and signs involving the musculoskeletal system: Secondary | ICD-10-CM | POA: Diagnosis not present

## 2023-09-30 DIAGNOSIS — M24111 Other articular cartilage disorders, right shoulder: Secondary | ICD-10-CM | POA: Diagnosis not present

## 2023-10-03 DIAGNOSIS — R29898 Other symptoms and signs involving the musculoskeletal system: Secondary | ICD-10-CM | POA: Diagnosis not present

## 2023-10-03 DIAGNOSIS — M19012 Primary osteoarthritis, left shoulder: Secondary | ICD-10-CM | POA: Diagnosis not present

## 2023-10-03 DIAGNOSIS — M25511 Pain in right shoulder: Secondary | ICD-10-CM | POA: Diagnosis not present

## 2023-10-03 DIAGNOSIS — M24111 Other articular cartilage disorders, right shoulder: Secondary | ICD-10-CM | POA: Diagnosis not present

## 2023-10-03 DIAGNOSIS — R278 Other lack of coordination: Secondary | ICD-10-CM | POA: Diagnosis not present

## 2023-10-03 DIAGNOSIS — M25512 Pain in left shoulder: Secondary | ICD-10-CM | POA: Diagnosis not present

## 2023-10-03 DIAGNOSIS — M63811 Disorders of muscle in diseases classified elsewhere, right shoulder: Secondary | ICD-10-CM | POA: Diagnosis not present

## 2023-10-03 DIAGNOSIS — M19011 Primary osteoarthritis, right shoulder: Secondary | ICD-10-CM | POA: Diagnosis not present

## 2023-10-03 DIAGNOSIS — M542 Cervicalgia: Secondary | ICD-10-CM | POA: Diagnosis not present

## 2023-10-11 DIAGNOSIS — M25512 Pain in left shoulder: Secondary | ICD-10-CM | POA: Diagnosis not present

## 2023-10-11 DIAGNOSIS — M19011 Primary osteoarthritis, right shoulder: Secondary | ICD-10-CM | POA: Diagnosis not present

## 2023-10-11 DIAGNOSIS — M542 Cervicalgia: Secondary | ICD-10-CM | POA: Diagnosis not present

## 2023-10-11 DIAGNOSIS — M19012 Primary osteoarthritis, left shoulder: Secondary | ICD-10-CM | POA: Diagnosis not present

## 2023-10-11 DIAGNOSIS — M24111 Other articular cartilage disorders, right shoulder: Secondary | ICD-10-CM | POA: Diagnosis not present

## 2023-10-11 DIAGNOSIS — M63811 Disorders of muscle in diseases classified elsewhere, right shoulder: Secondary | ICD-10-CM | POA: Diagnosis not present

## 2023-10-11 DIAGNOSIS — R278 Other lack of coordination: Secondary | ICD-10-CM | POA: Diagnosis not present

## 2023-10-11 DIAGNOSIS — M25511 Pain in right shoulder: Secondary | ICD-10-CM | POA: Diagnosis not present

## 2023-10-11 DIAGNOSIS — R29898 Other symptoms and signs involving the musculoskeletal system: Secondary | ICD-10-CM | POA: Diagnosis not present

## 2023-10-14 DIAGNOSIS — M25512 Pain in left shoulder: Secondary | ICD-10-CM | POA: Diagnosis not present

## 2023-10-14 DIAGNOSIS — R278 Other lack of coordination: Secondary | ICD-10-CM | POA: Diagnosis not present

## 2023-10-14 DIAGNOSIS — R29898 Other symptoms and signs involving the musculoskeletal system: Secondary | ICD-10-CM | POA: Diagnosis not present

## 2023-10-14 DIAGNOSIS — M19012 Primary osteoarthritis, left shoulder: Secondary | ICD-10-CM | POA: Diagnosis not present

## 2023-10-14 DIAGNOSIS — M24111 Other articular cartilage disorders, right shoulder: Secondary | ICD-10-CM | POA: Diagnosis not present

## 2023-10-14 DIAGNOSIS — M542 Cervicalgia: Secondary | ICD-10-CM | POA: Diagnosis not present

## 2023-10-14 DIAGNOSIS — M63811 Disorders of muscle in diseases classified elsewhere, right shoulder: Secondary | ICD-10-CM | POA: Diagnosis not present

## 2023-10-14 DIAGNOSIS — M25511 Pain in right shoulder: Secondary | ICD-10-CM | POA: Diagnosis not present

## 2023-10-14 DIAGNOSIS — M19011 Primary osteoarthritis, right shoulder: Secondary | ICD-10-CM | POA: Diagnosis not present

## 2023-10-17 DIAGNOSIS — Z0000 Encounter for general adult medical examination without abnormal findings: Secondary | ICD-10-CM | POA: Diagnosis not present

## 2023-10-17 DIAGNOSIS — M810 Age-related osteoporosis without current pathological fracture: Secondary | ICD-10-CM | POA: Diagnosis not present

## 2023-10-17 DIAGNOSIS — I10 Essential (primary) hypertension: Secondary | ICD-10-CM | POA: Diagnosis not present

## 2023-10-17 DIAGNOSIS — K9041 Non-celiac gluten sensitivity: Secondary | ICD-10-CM | POA: Diagnosis not present

## 2023-10-17 DIAGNOSIS — Z1331 Encounter for screening for depression: Secondary | ICD-10-CM | POA: Diagnosis not present

## 2023-10-17 DIAGNOSIS — E7800 Pure hypercholesterolemia, unspecified: Secondary | ICD-10-CM | POA: Diagnosis not present

## 2023-10-17 DIAGNOSIS — Z79899 Other long term (current) drug therapy: Secondary | ICD-10-CM | POA: Diagnosis not present

## 2023-10-18 DIAGNOSIS — M19011 Primary osteoarthritis, right shoulder: Secondary | ICD-10-CM | POA: Diagnosis not present

## 2023-10-18 DIAGNOSIS — R278 Other lack of coordination: Secondary | ICD-10-CM | POA: Diagnosis not present

## 2023-10-18 DIAGNOSIS — M63811 Disorders of muscle in diseases classified elsewhere, right shoulder: Secondary | ICD-10-CM | POA: Diagnosis not present

## 2023-10-18 DIAGNOSIS — M25511 Pain in right shoulder: Secondary | ICD-10-CM | POA: Diagnosis not present

## 2023-10-18 DIAGNOSIS — M25512 Pain in left shoulder: Secondary | ICD-10-CM | POA: Diagnosis not present

## 2023-10-18 DIAGNOSIS — R29898 Other symptoms and signs involving the musculoskeletal system: Secondary | ICD-10-CM | POA: Diagnosis not present

## 2023-10-18 DIAGNOSIS — M19012 Primary osteoarthritis, left shoulder: Secondary | ICD-10-CM | POA: Diagnosis not present

## 2023-10-18 DIAGNOSIS — M24111 Other articular cartilage disorders, right shoulder: Secondary | ICD-10-CM | POA: Diagnosis not present

## 2023-10-18 DIAGNOSIS — M542 Cervicalgia: Secondary | ICD-10-CM | POA: Diagnosis not present

## 2023-10-21 DIAGNOSIS — M19012 Primary osteoarthritis, left shoulder: Secondary | ICD-10-CM | POA: Diagnosis not present

## 2023-10-21 DIAGNOSIS — R29898 Other symptoms and signs involving the musculoskeletal system: Secondary | ICD-10-CM | POA: Diagnosis not present

## 2023-10-21 DIAGNOSIS — M19011 Primary osteoarthritis, right shoulder: Secondary | ICD-10-CM | POA: Diagnosis not present

## 2023-10-21 DIAGNOSIS — R278 Other lack of coordination: Secondary | ICD-10-CM | POA: Diagnosis not present

## 2023-10-21 DIAGNOSIS — M25512 Pain in left shoulder: Secondary | ICD-10-CM | POA: Diagnosis not present

## 2023-10-21 DIAGNOSIS — M25511 Pain in right shoulder: Secondary | ICD-10-CM | POA: Diagnosis not present

## 2023-10-21 DIAGNOSIS — M542 Cervicalgia: Secondary | ICD-10-CM | POA: Diagnosis not present

## 2023-10-21 DIAGNOSIS — M63811 Disorders of muscle in diseases classified elsewhere, right shoulder: Secondary | ICD-10-CM | POA: Diagnosis not present

## 2023-10-21 DIAGNOSIS — M24111 Other articular cartilage disorders, right shoulder: Secondary | ICD-10-CM | POA: Diagnosis not present

## 2023-10-24 DIAGNOSIS — M25511 Pain in right shoulder: Secondary | ICD-10-CM | POA: Diagnosis not present

## 2023-10-24 DIAGNOSIS — M19012 Primary osteoarthritis, left shoulder: Secondary | ICD-10-CM | POA: Diagnosis not present

## 2023-10-24 DIAGNOSIS — R278 Other lack of coordination: Secondary | ICD-10-CM | POA: Diagnosis not present

## 2023-10-24 DIAGNOSIS — M63811 Disorders of muscle in diseases classified elsewhere, right shoulder: Secondary | ICD-10-CM | POA: Diagnosis not present

## 2023-10-24 DIAGNOSIS — M25512 Pain in left shoulder: Secondary | ICD-10-CM | POA: Diagnosis not present

## 2023-10-24 DIAGNOSIS — M24111 Other articular cartilage disorders, right shoulder: Secondary | ICD-10-CM | POA: Diagnosis not present

## 2023-10-24 DIAGNOSIS — M542 Cervicalgia: Secondary | ICD-10-CM | POA: Diagnosis not present

## 2023-10-24 DIAGNOSIS — M19011 Primary osteoarthritis, right shoulder: Secondary | ICD-10-CM | POA: Diagnosis not present

## 2023-10-24 DIAGNOSIS — R29898 Other symptoms and signs involving the musculoskeletal system: Secondary | ICD-10-CM | POA: Diagnosis not present

## 2023-11-04 DIAGNOSIS — M63811 Disorders of muscle in diseases classified elsewhere, right shoulder: Secondary | ICD-10-CM | POA: Diagnosis not present

## 2023-11-04 DIAGNOSIS — N958 Other specified menopausal and perimenopausal disorders: Secondary | ICD-10-CM | POA: Diagnosis not present

## 2023-11-04 DIAGNOSIS — R29898 Other symptoms and signs involving the musculoskeletal system: Secondary | ICD-10-CM | POA: Diagnosis not present

## 2023-11-04 DIAGNOSIS — M24111 Other articular cartilage disorders, right shoulder: Secondary | ICD-10-CM | POA: Diagnosis not present

## 2023-11-04 DIAGNOSIS — Z9189 Other specified personal risk factors, not elsewhere classified: Secondary | ICD-10-CM | POA: Diagnosis not present

## 2023-11-04 DIAGNOSIS — M19012 Primary osteoarthritis, left shoulder: Secondary | ICD-10-CM | POA: Diagnosis not present

## 2023-11-04 DIAGNOSIS — R278 Other lack of coordination: Secondary | ICD-10-CM | POA: Diagnosis not present

## 2023-11-04 DIAGNOSIS — M19011 Primary osteoarthritis, right shoulder: Secondary | ICD-10-CM | POA: Diagnosis not present

## 2023-11-04 DIAGNOSIS — M25512 Pain in left shoulder: Secondary | ICD-10-CM | POA: Diagnosis not present

## 2023-11-04 DIAGNOSIS — Z01419 Encounter for gynecological examination (general) (routine) without abnormal findings: Secondary | ICD-10-CM | POA: Diagnosis not present

## 2023-11-04 DIAGNOSIS — M542 Cervicalgia: Secondary | ICD-10-CM | POA: Diagnosis not present

## 2023-11-04 DIAGNOSIS — M25511 Pain in right shoulder: Secondary | ICD-10-CM | POA: Diagnosis not present

## 2023-11-07 DIAGNOSIS — M542 Cervicalgia: Secondary | ICD-10-CM | POA: Diagnosis not present

## 2023-11-07 DIAGNOSIS — M19012 Primary osteoarthritis, left shoulder: Secondary | ICD-10-CM | POA: Diagnosis not present

## 2023-11-07 DIAGNOSIS — M24111 Other articular cartilage disorders, right shoulder: Secondary | ICD-10-CM | POA: Diagnosis not present

## 2023-11-07 DIAGNOSIS — R278 Other lack of coordination: Secondary | ICD-10-CM | POA: Diagnosis not present

## 2023-11-07 DIAGNOSIS — M25511 Pain in right shoulder: Secondary | ICD-10-CM | POA: Diagnosis not present

## 2023-11-07 DIAGNOSIS — R29898 Other symptoms and signs involving the musculoskeletal system: Secondary | ICD-10-CM | POA: Diagnosis not present

## 2023-11-07 DIAGNOSIS — M25512 Pain in left shoulder: Secondary | ICD-10-CM | POA: Diagnosis not present

## 2023-11-07 DIAGNOSIS — M19011 Primary osteoarthritis, right shoulder: Secondary | ICD-10-CM | POA: Diagnosis not present

## 2023-11-07 DIAGNOSIS — M63811 Disorders of muscle in diseases classified elsewhere, right shoulder: Secondary | ICD-10-CM | POA: Diagnosis not present

## 2023-11-21 DIAGNOSIS — M24111 Other articular cartilage disorders, right shoulder: Secondary | ICD-10-CM | POA: Diagnosis not present

## 2023-11-21 DIAGNOSIS — M63811 Disorders of muscle in diseases classified elsewhere, right shoulder: Secondary | ICD-10-CM | POA: Diagnosis not present

## 2023-11-21 DIAGNOSIS — M25512 Pain in left shoulder: Secondary | ICD-10-CM | POA: Diagnosis not present

## 2023-11-21 DIAGNOSIS — M19012 Primary osteoarthritis, left shoulder: Secondary | ICD-10-CM | POA: Diagnosis not present

## 2023-11-21 DIAGNOSIS — R278 Other lack of coordination: Secondary | ICD-10-CM | POA: Diagnosis not present

## 2023-11-21 DIAGNOSIS — M25511 Pain in right shoulder: Secondary | ICD-10-CM | POA: Diagnosis not present

## 2023-11-21 DIAGNOSIS — R29898 Other symptoms and signs involving the musculoskeletal system: Secondary | ICD-10-CM | POA: Diagnosis not present

## 2023-11-21 DIAGNOSIS — M19011 Primary osteoarthritis, right shoulder: Secondary | ICD-10-CM | POA: Diagnosis not present

## 2023-11-21 DIAGNOSIS — M542 Cervicalgia: Secondary | ICD-10-CM | POA: Diagnosis not present

## 2023-11-23 DIAGNOSIS — M810 Age-related osteoporosis without current pathological fracture: Secondary | ICD-10-CM | POA: Diagnosis not present

## 2023-11-25 DIAGNOSIS — M24111 Other articular cartilage disorders, right shoulder: Secondary | ICD-10-CM | POA: Diagnosis not present

## 2023-11-25 DIAGNOSIS — R278 Other lack of coordination: Secondary | ICD-10-CM | POA: Diagnosis not present

## 2023-11-25 DIAGNOSIS — M19012 Primary osteoarthritis, left shoulder: Secondary | ICD-10-CM | POA: Diagnosis not present

## 2023-11-25 DIAGNOSIS — M19011 Primary osteoarthritis, right shoulder: Secondary | ICD-10-CM | POA: Diagnosis not present

## 2023-11-25 DIAGNOSIS — R29898 Other symptoms and signs involving the musculoskeletal system: Secondary | ICD-10-CM | POA: Diagnosis not present

## 2023-11-25 DIAGNOSIS — M542 Cervicalgia: Secondary | ICD-10-CM | POA: Diagnosis not present

## 2023-11-25 DIAGNOSIS — M25512 Pain in left shoulder: Secondary | ICD-10-CM | POA: Diagnosis not present

## 2023-11-25 DIAGNOSIS — M63811 Disorders of muscle in diseases classified elsewhere, right shoulder: Secondary | ICD-10-CM | POA: Diagnosis not present

## 2023-11-25 DIAGNOSIS — M25511 Pain in right shoulder: Secondary | ICD-10-CM | POA: Diagnosis not present

## 2023-11-30 DIAGNOSIS — R29898 Other symptoms and signs involving the musculoskeletal system: Secondary | ICD-10-CM | POA: Diagnosis not present

## 2023-11-30 DIAGNOSIS — M25511 Pain in right shoulder: Secondary | ICD-10-CM | POA: Diagnosis not present

## 2023-11-30 DIAGNOSIS — M24111 Other articular cartilage disorders, right shoulder: Secondary | ICD-10-CM | POA: Diagnosis not present

## 2023-11-30 DIAGNOSIS — M19011 Primary osteoarthritis, right shoulder: Secondary | ICD-10-CM | POA: Diagnosis not present

## 2023-11-30 DIAGNOSIS — M63811 Disorders of muscle in diseases classified elsewhere, right shoulder: Secondary | ICD-10-CM | POA: Diagnosis not present

## 2023-11-30 DIAGNOSIS — M19012 Primary osteoarthritis, left shoulder: Secondary | ICD-10-CM | POA: Diagnosis not present

## 2023-11-30 DIAGNOSIS — R278 Other lack of coordination: Secondary | ICD-10-CM | POA: Diagnosis not present

## 2023-11-30 DIAGNOSIS — M542 Cervicalgia: Secondary | ICD-10-CM | POA: Diagnosis not present

## 2023-11-30 DIAGNOSIS — M25512 Pain in left shoulder: Secondary | ICD-10-CM | POA: Diagnosis not present

## 2023-12-02 DIAGNOSIS — M24111 Other articular cartilage disorders, right shoulder: Secondary | ICD-10-CM | POA: Diagnosis not present

## 2023-12-02 DIAGNOSIS — R29898 Other symptoms and signs involving the musculoskeletal system: Secondary | ICD-10-CM | POA: Diagnosis not present

## 2023-12-02 DIAGNOSIS — M19011 Primary osteoarthritis, right shoulder: Secondary | ICD-10-CM | POA: Diagnosis not present

## 2023-12-02 DIAGNOSIS — M19012 Primary osteoarthritis, left shoulder: Secondary | ICD-10-CM | POA: Diagnosis not present

## 2023-12-02 DIAGNOSIS — M542 Cervicalgia: Secondary | ICD-10-CM | POA: Diagnosis not present

## 2023-12-02 DIAGNOSIS — M25512 Pain in left shoulder: Secondary | ICD-10-CM | POA: Diagnosis not present

## 2023-12-02 DIAGNOSIS — M25511 Pain in right shoulder: Secondary | ICD-10-CM | POA: Diagnosis not present

## 2023-12-02 DIAGNOSIS — R278 Other lack of coordination: Secondary | ICD-10-CM | POA: Diagnosis not present

## 2023-12-02 DIAGNOSIS — M63811 Disorders of muscle in diseases classified elsewhere, right shoulder: Secondary | ICD-10-CM | POA: Diagnosis not present

## 2023-12-05 DIAGNOSIS — M25511 Pain in right shoulder: Secondary | ICD-10-CM | POA: Diagnosis not present

## 2023-12-05 DIAGNOSIS — M19011 Primary osteoarthritis, right shoulder: Secondary | ICD-10-CM | POA: Diagnosis not present

## 2023-12-05 DIAGNOSIS — R278 Other lack of coordination: Secondary | ICD-10-CM | POA: Diagnosis not present

## 2023-12-05 DIAGNOSIS — R29898 Other symptoms and signs involving the musculoskeletal system: Secondary | ICD-10-CM | POA: Diagnosis not present

## 2023-12-05 DIAGNOSIS — M24111 Other articular cartilage disorders, right shoulder: Secondary | ICD-10-CM | POA: Diagnosis not present

## 2023-12-05 DIAGNOSIS — M542 Cervicalgia: Secondary | ICD-10-CM | POA: Diagnosis not present

## 2023-12-05 DIAGNOSIS — M63811 Disorders of muscle in diseases classified elsewhere, right shoulder: Secondary | ICD-10-CM | POA: Diagnosis not present

## 2023-12-05 DIAGNOSIS — M25512 Pain in left shoulder: Secondary | ICD-10-CM | POA: Diagnosis not present

## 2023-12-05 DIAGNOSIS — M19012 Primary osteoarthritis, left shoulder: Secondary | ICD-10-CM | POA: Diagnosis not present

## 2023-12-12 DIAGNOSIS — M19011 Primary osteoarthritis, right shoulder: Secondary | ICD-10-CM | POA: Diagnosis not present

## 2023-12-12 DIAGNOSIS — M25512 Pain in left shoulder: Secondary | ICD-10-CM | POA: Diagnosis not present

## 2023-12-12 DIAGNOSIS — M19012 Primary osteoarthritis, left shoulder: Secondary | ICD-10-CM | POA: Diagnosis not present

## 2023-12-12 DIAGNOSIS — M542 Cervicalgia: Secondary | ICD-10-CM | POA: Diagnosis not present

## 2023-12-12 DIAGNOSIS — M24111 Other articular cartilage disorders, right shoulder: Secondary | ICD-10-CM | POA: Diagnosis not present

## 2023-12-12 DIAGNOSIS — R29898 Other symptoms and signs involving the musculoskeletal system: Secondary | ICD-10-CM | POA: Diagnosis not present

## 2023-12-12 DIAGNOSIS — R278 Other lack of coordination: Secondary | ICD-10-CM | POA: Diagnosis not present

## 2023-12-12 DIAGNOSIS — M25511 Pain in right shoulder: Secondary | ICD-10-CM | POA: Diagnosis not present

## 2023-12-12 DIAGNOSIS — M63811 Disorders of muscle in diseases classified elsewhere, right shoulder: Secondary | ICD-10-CM | POA: Diagnosis not present

## 2023-12-15 DIAGNOSIS — M19011 Primary osteoarthritis, right shoulder: Secondary | ICD-10-CM | POA: Diagnosis not present

## 2023-12-15 DIAGNOSIS — M24111 Other articular cartilage disorders, right shoulder: Secondary | ICD-10-CM | POA: Diagnosis not present

## 2023-12-15 DIAGNOSIS — M542 Cervicalgia: Secondary | ICD-10-CM | POA: Diagnosis not present

## 2023-12-15 DIAGNOSIS — M19012 Primary osteoarthritis, left shoulder: Secondary | ICD-10-CM | POA: Diagnosis not present

## 2023-12-15 DIAGNOSIS — M63811 Disorders of muscle in diseases classified elsewhere, right shoulder: Secondary | ICD-10-CM | POA: Diagnosis not present

## 2023-12-15 DIAGNOSIS — R278 Other lack of coordination: Secondary | ICD-10-CM | POA: Diagnosis not present

## 2023-12-15 DIAGNOSIS — R29898 Other symptoms and signs involving the musculoskeletal system: Secondary | ICD-10-CM | POA: Diagnosis not present

## 2023-12-15 DIAGNOSIS — M25511 Pain in right shoulder: Secondary | ICD-10-CM | POA: Diagnosis not present

## 2023-12-15 DIAGNOSIS — M25512 Pain in left shoulder: Secondary | ICD-10-CM | POA: Diagnosis not present

## 2023-12-19 DIAGNOSIS — M24111 Other articular cartilage disorders, right shoulder: Secondary | ICD-10-CM | POA: Diagnosis not present

## 2023-12-19 DIAGNOSIS — M25512 Pain in left shoulder: Secondary | ICD-10-CM | POA: Diagnosis not present

## 2023-12-19 DIAGNOSIS — R29898 Other symptoms and signs involving the musculoskeletal system: Secondary | ICD-10-CM | POA: Diagnosis not present

## 2023-12-19 DIAGNOSIS — M19012 Primary osteoarthritis, left shoulder: Secondary | ICD-10-CM | POA: Diagnosis not present

## 2023-12-19 DIAGNOSIS — R278 Other lack of coordination: Secondary | ICD-10-CM | POA: Diagnosis not present

## 2023-12-19 DIAGNOSIS — M542 Cervicalgia: Secondary | ICD-10-CM | POA: Diagnosis not present

## 2023-12-19 DIAGNOSIS — M25511 Pain in right shoulder: Secondary | ICD-10-CM | POA: Diagnosis not present

## 2023-12-19 DIAGNOSIS — M63811 Disorders of muscle in diseases classified elsewhere, right shoulder: Secondary | ICD-10-CM | POA: Diagnosis not present

## 2023-12-19 DIAGNOSIS — M19011 Primary osteoarthritis, right shoulder: Secondary | ICD-10-CM | POA: Diagnosis not present

## 2023-12-22 NOTE — Progress Notes (Signed)
 Brooke [name] [address] [place] [address] Phone: [phone] Subjective:   [name] am a scribe for Dr. Claudene.   I'm seeing this patient by the request  of:  Dwight Trula SQUIBB, MD  CC: hand pain   [name]  06/27/2023 Repeat injection given today and tolerated the procedure well, discussed icing regimen and home exercises, discussed which activities to do and which ones to avoid.  Increase activity slowly.  Follow-up again in 6 to 8 weeks      Chronic problem with exacerbation.  Discussed icing regimen and home exercises, which activities to do and which ones to avoid.  Increase activity slowly.  Discussed icing regimen.  Follow-up with me again in 6 to 8 weeks     Update 12/23/2023 [name] is a 83 y.o. female coming in with complaint of R shoulder pain.  Known some arthritic changes of the glenohumeral and the acromioclavicular joint.  Has been going to physical therapy very frequently.  Patient states woke up one more about a week ago the hand was really stiff and was doing hand exercises and the middle finger stopped moving. Wearing a splint to keep it immobilized, icing, and using biofreeze to treat it. It got better yesterday but wanted to keep the appointment to make sure nothing is wrong.        Past Medical History:  Diagnosis Date   Anxiety    Depression    no history of.   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   Saturday night palsy   Osteopenia    Rheumatic fever    no cardiology issues   Past Surgical History:  Procedure Laterality Date   CATARACT EXTRACTION Right    COLONOSCOPY WITH PROPOFOL  N/A 01/08/2014   Procedure: COLONOSCOPY WITH PROPOFOL ;  Surgeon: Gladis MARLA Louder, MD;  Location: [facility];  Service: Endoscopy;  Laterality: N/A;   PELVIC LAPAROSCOPY     [name]     TONSILLECTOMY     WRIST FRACTURE SURGERY Left    '11-hardware retained   Social History   Socioeconomic History   Marital status: Divorced    Spouse name: Not on file   Number of children: Not on file   Years of education: Not on file   Highest education level: Not on file  Occupational History   Not on file  Tobacco Use   Smoking status: Never   Smokeless tobacco: Never  Substance and Sexual Activity   Alcohol use: Yes    Alcohol/week: 5.0 standard drinks of alcohol    Types: 5 Standard drinks or equivalent per week    Comment: 2 wine glasses per day.   Drug use: No   Sexual activity: Yes    Partners: Male    Comment: husband vasectomy  Other Topics Concern   Not on file  Social History Narrative   Not on file   Social Drivers of Health   Financial Resource Strain: Not on file  Food Insecurity: Not on file  Transportation Needs: Not on file  Physical Activity: Not on file  Stress: Not on file  Social Connections: Not on file   Allergies  Allergen Reactions   Latex     Long period ... Red skin    Family History  Problem Relation Age of Onset  Cancer Sister        ductal invasive cancer   Breast cancer Sister 68   Cancer Maternal Grandmother        colon   Cancer Father        lung (smoker)   Depression Sister    Osteoporosis Sister     Current Outpatient Medications (Endocrine & Metabolic):    denosumab  (PROLIA ) 60 MG/ML SOSY injection, Inject 60 mg into the skin every 6 (six) months.   predniSONE  (DELTASONE ) 20 MG tablet, Take 1 tablet (20 mg total) by mouth daily with breakfast.  Current Outpatient Medications (Cardiovascular):    rosuvastatin  (CRESTOR ) 5 MG tablet, Take 2 tablets (10 mg total) by mouth daily.  Current Outpatient Medications (Respiratory):    guaiFENesin (MUCINEX) 600 MG 12 hr tablet, Take 600 mg by mouth 2 (two) times daily as needed for cough.   loratadine (CLARITIN) 10 MG tablet, Take 10 mg by mouth daily as needed for  allergies.  Current Outpatient Medications (Analgesics):    naproxen sodium (ALEVE) 220 MG tablet, Take 220 mg by mouth 2 (two) times daily as needed (for pain).   Current Outpatient Medications (Other):    ALPRAZolam (XANAX) 0.5 MG tablet, Take 0.5 mg by mouth daily as needed for sleep.    calcium -vitamin D (OSCAL WITH D) 500-200 MG-UNIT tablet, Take 1 tablet by mouth 2 (two) times daily.   Cholecalciferol (VITAMIN D) 50 MCG (2000 UT) tablet, Take 2,000 Units by mouth daily.   clobetasol cream (TEMOVATE) 0.05 %, Apply 1 application topically 2 (two) times daily as needed (for rash).   diclofenac Sodium (VOLTAREN) 1 % GEL, Apply 2 g topically 4 (four) times daily as needed (for pain).   estradiol (ESTRACE) 0.1 MG/GM vaginal cream, Place 1 Applicatorful vaginally 2 (two) times a week.   Kelp 100 MG TABS, Take 100 mg by mouth daily.   melatonin 3 MG TABS tablet, Take 3 mg by mouth at bedtime.   Misc Natural Products (TART CHERRY ADVANCED PO), Take 1 tablet by mouth daily.   Turmeric 500 MG TABS, Take 500 mg by mouth daily.   Reviewed prior external information including notes and imaging from  primary care provider As well as notes that were available from care everywhere and other healthcare systems.  Past medical history, social, surgical and family history all reviewed in electronic medical record.  No pertanent information unless stated regarding to the chief complaint.   Review of Systems:  No headache, visual changes, nausea, vomiting, diarrhea, constipation, dizziness, abdominal pain, skin rash, fevers, chills, night sweats, weight loss, swollen lymph nodes, body aches, joint swelling, chest pain, shortness of breath, mood changes. POSITIVE muscle aches  Objective  Blood pressure 122/64, pulse 89, height 5' 5 (1.651 m), last menstrual period 03/08/1992, SpO2 98%.   General: No apparent distress alert and oriented x3 mood and affect normal, dressed appropriately.  HEENT: Pupils  equal, extraocular movements intact  Respiratory: Patient's speak in full sentences and does not appear short of breath  Cardiovascular: No lower extremity edema, non tender, no erythema  Hand exam on the left hand shows that patient does have a trigger nodule noted at the A2 pulley.  Procedure: Real-time Ultrasound Guided Injection of left flexor tendon sheath third finger Device: GE Logiq Q7 Ultrasound guided injection is preferred based studies that show increased duration, increased effect, greater accuracy, decreased procedural pain, increased response rate, and decreased cost with ultrasound guided versus blind injection.  Verbal informed consent obtained.  Time-out conducted.  Noted no overlying erythema, induration, or other signs of local infection.  Skin prepped in a sterile fashion.  Local anesthesia: Topical Ethyl chloride.  With sterile technique and under real time ultrasound guidance: With a 25-gauge half inch needle injected with 0.5 cc of 0.5% Marcaine and 0.5 cc of Kenalog  40 mg/mL Completed without difficulty  Pain immediately resolved suggesting accurate placement of the medication.  Advised to call if fevers/chills, erythema, induration, drainage, or persistent bleeding.  Images saved Impression: Technically successful ultrasound guided injection.    Impression and Recommendations:    Trigger finger, left middle finger Given injection today and tolerated the procedure well, discussed icing regimen and home exercises, discussed which activities to do and which ones to avoid.  Increase activity slowly.  Discussed icing regimen.  Follow-up again in 6 to 12 weeks otherwise.   The above documentation has been reviewed and is accurate and complete Aithana Kushner M Akhila Mahnken, DO

## 2023-12-23 ENCOUNTER — Encounter: Payer: Self-pay | Admitting: Family Medicine

## 2023-12-23 ENCOUNTER — Other Ambulatory Visit: Payer: Self-pay

## 2023-12-23 ENCOUNTER — Ambulatory Visit: Admitting: Family Medicine

## 2023-12-23 VITALS — BP 122/64 | HR 89 | Ht 65.0 in

## 2023-12-23 DIAGNOSIS — M79645 Pain in left finger(s): Secondary | ICD-10-CM | POA: Diagnosis not present

## 2023-12-23 DIAGNOSIS — R278 Other lack of coordination: Secondary | ICD-10-CM | POA: Diagnosis not present

## 2023-12-23 DIAGNOSIS — M19012 Primary osteoarthritis, left shoulder: Secondary | ICD-10-CM | POA: Diagnosis not present

## 2023-12-23 DIAGNOSIS — M63811 Disorders of muscle in diseases classified elsewhere, right shoulder: Secondary | ICD-10-CM | POA: Diagnosis not present

## 2023-12-23 DIAGNOSIS — M65332 Trigger finger, left middle finger: Secondary | ICD-10-CM | POA: Diagnosis not present

## 2023-12-23 DIAGNOSIS — M542 Cervicalgia: Secondary | ICD-10-CM | POA: Diagnosis not present

## 2023-12-23 DIAGNOSIS — M25511 Pain in right shoulder: Secondary | ICD-10-CM | POA: Diagnosis not present

## 2023-12-23 DIAGNOSIS — M24111 Other articular cartilage disorders, right shoulder: Secondary | ICD-10-CM | POA: Diagnosis not present

## 2023-12-23 DIAGNOSIS — M19011 Primary osteoarthritis, right shoulder: Secondary | ICD-10-CM | POA: Diagnosis not present

## 2023-12-23 DIAGNOSIS — M25512 Pain in left shoulder: Secondary | ICD-10-CM | POA: Diagnosis not present

## 2023-12-23 DIAGNOSIS — R29898 Other symptoms and signs involving the musculoskeletal system: Secondary | ICD-10-CM | POA: Diagnosis not present

## 2023-12-23 NOTE — Patient Instructions (Addendum)
 Good to see you. Injected left middle finger today. Let's watch the shoulder.  See me again in 3 months.

## 2023-12-23 NOTE — Assessment & Plan Note (Signed)
 Given injection today and tolerated the procedure well, discussed icing regimen and home exercises, discussed which activities to do and which ones to avoid.  Increase activity slowly.  Discussed icing regimen.  Follow-up again in 6 to 12 weeks otherwise.

## 2023-12-26 DIAGNOSIS — M19011 Primary osteoarthritis, right shoulder: Secondary | ICD-10-CM | POA: Diagnosis not present

## 2023-12-26 DIAGNOSIS — R278 Other lack of coordination: Secondary | ICD-10-CM | POA: Diagnosis not present

## 2023-12-26 DIAGNOSIS — M63811 Disorders of muscle in diseases classified elsewhere, right shoulder: Secondary | ICD-10-CM | POA: Diagnosis not present

## 2023-12-26 DIAGNOSIS — M25512 Pain in left shoulder: Secondary | ICD-10-CM | POA: Diagnosis not present

## 2023-12-26 DIAGNOSIS — R29898 Other symptoms and signs involving the musculoskeletal system: Secondary | ICD-10-CM | POA: Diagnosis not present

## 2023-12-26 DIAGNOSIS — M542 Cervicalgia: Secondary | ICD-10-CM | POA: Diagnosis not present

## 2023-12-26 DIAGNOSIS — M24111 Other articular cartilage disorders, right shoulder: Secondary | ICD-10-CM | POA: Diagnosis not present

## 2023-12-26 DIAGNOSIS — M19012 Primary osteoarthritis, left shoulder: Secondary | ICD-10-CM | POA: Diagnosis not present

## 2023-12-26 DIAGNOSIS — M25511 Pain in right shoulder: Secondary | ICD-10-CM | POA: Diagnosis not present

## 2023-12-30 DIAGNOSIS — R29898 Other symptoms and signs involving the musculoskeletal system: Secondary | ICD-10-CM | POA: Diagnosis not present

## 2023-12-30 DIAGNOSIS — R278 Other lack of coordination: Secondary | ICD-10-CM | POA: Diagnosis not present

## 2023-12-30 DIAGNOSIS — M19011 Primary osteoarthritis, right shoulder: Secondary | ICD-10-CM | POA: Diagnosis not present

## 2023-12-30 DIAGNOSIS — M63811 Disorders of muscle in diseases classified elsewhere, right shoulder: Secondary | ICD-10-CM | POA: Diagnosis not present

## 2023-12-30 DIAGNOSIS — M542 Cervicalgia: Secondary | ICD-10-CM | POA: Diagnosis not present

## 2023-12-30 DIAGNOSIS — M19012 Primary osteoarthritis, left shoulder: Secondary | ICD-10-CM | POA: Diagnosis not present

## 2023-12-30 DIAGNOSIS — M24111 Other articular cartilage disorders, right shoulder: Secondary | ICD-10-CM | POA: Diagnosis not present

## 2023-12-30 DIAGNOSIS — M25512 Pain in left shoulder: Secondary | ICD-10-CM | POA: Diagnosis not present

## 2023-12-30 DIAGNOSIS — M25511 Pain in right shoulder: Secondary | ICD-10-CM | POA: Diagnosis not present

## 2024-01-05 DIAGNOSIS — M542 Cervicalgia: Secondary | ICD-10-CM | POA: Diagnosis not present

## 2024-01-05 DIAGNOSIS — R278 Other lack of coordination: Secondary | ICD-10-CM | POA: Diagnosis not present

## 2024-01-05 DIAGNOSIS — M25512 Pain in left shoulder: Secondary | ICD-10-CM | POA: Diagnosis not present

## 2024-01-05 DIAGNOSIS — M63811 Disorders of muscle in diseases classified elsewhere, right shoulder: Secondary | ICD-10-CM | POA: Diagnosis not present

## 2024-01-05 DIAGNOSIS — M19012 Primary osteoarthritis, left shoulder: Secondary | ICD-10-CM | POA: Diagnosis not present

## 2024-01-05 DIAGNOSIS — M19011 Primary osteoarthritis, right shoulder: Secondary | ICD-10-CM | POA: Diagnosis not present

## 2024-01-05 DIAGNOSIS — M25511 Pain in right shoulder: Secondary | ICD-10-CM | POA: Diagnosis not present

## 2024-01-05 DIAGNOSIS — R29898 Other symptoms and signs involving the musculoskeletal system: Secondary | ICD-10-CM | POA: Diagnosis not present

## 2024-01-05 DIAGNOSIS — M24111 Other articular cartilage disorders, right shoulder: Secondary | ICD-10-CM | POA: Diagnosis not present

## 2024-01-09 ENCOUNTER — Encounter: Payer: Self-pay | Admitting: Radiology

## 2024-01-13 DIAGNOSIS — R278 Other lack of coordination: Secondary | ICD-10-CM | POA: Diagnosis not present

## 2024-01-13 DIAGNOSIS — M24111 Other articular cartilage disorders, right shoulder: Secondary | ICD-10-CM | POA: Diagnosis not present

## 2024-01-13 DIAGNOSIS — M25511 Pain in right shoulder: Secondary | ICD-10-CM | POA: Diagnosis not present

## 2024-01-13 DIAGNOSIS — M25512 Pain in left shoulder: Secondary | ICD-10-CM | POA: Diagnosis not present

## 2024-01-13 DIAGNOSIS — M63811 Disorders of muscle in diseases classified elsewhere, right shoulder: Secondary | ICD-10-CM | POA: Diagnosis not present

## 2024-01-13 DIAGNOSIS — M19011 Primary osteoarthritis, right shoulder: Secondary | ICD-10-CM | POA: Diagnosis not present

## 2024-01-13 DIAGNOSIS — R29898 Other symptoms and signs involving the musculoskeletal system: Secondary | ICD-10-CM | POA: Diagnosis not present

## 2024-01-13 DIAGNOSIS — M542 Cervicalgia: Secondary | ICD-10-CM | POA: Diagnosis not present

## 2024-01-13 DIAGNOSIS — M19012 Primary osteoarthritis, left shoulder: Secondary | ICD-10-CM | POA: Diagnosis not present

## 2024-01-18 DIAGNOSIS — M542 Cervicalgia: Secondary | ICD-10-CM | POA: Diagnosis not present

## 2024-01-18 DIAGNOSIS — M19012 Primary osteoarthritis, left shoulder: Secondary | ICD-10-CM | POA: Diagnosis not present

## 2024-01-18 DIAGNOSIS — M25511 Pain in right shoulder: Secondary | ICD-10-CM | POA: Diagnosis not present

## 2024-01-18 DIAGNOSIS — R29898 Other symptoms and signs involving the musculoskeletal system: Secondary | ICD-10-CM | POA: Diagnosis not present

## 2024-01-18 DIAGNOSIS — M63811 Disorders of muscle in diseases classified elsewhere, right shoulder: Secondary | ICD-10-CM | POA: Diagnosis not present

## 2024-01-18 DIAGNOSIS — M24111 Other articular cartilage disorders, right shoulder: Secondary | ICD-10-CM | POA: Diagnosis not present

## 2024-01-18 DIAGNOSIS — M19011 Primary osteoarthritis, right shoulder: Secondary | ICD-10-CM | POA: Diagnosis not present

## 2024-01-18 DIAGNOSIS — M25512 Pain in left shoulder: Secondary | ICD-10-CM | POA: Diagnosis not present

## 2024-01-18 DIAGNOSIS — R278 Other lack of coordination: Secondary | ICD-10-CM | POA: Diagnosis not present

## 2024-01-25 DIAGNOSIS — M25512 Pain in left shoulder: Secondary | ICD-10-CM | POA: Diagnosis not present

## 2024-01-25 DIAGNOSIS — M19011 Primary osteoarthritis, right shoulder: Secondary | ICD-10-CM | POA: Diagnosis not present

## 2024-01-25 DIAGNOSIS — R278 Other lack of coordination: Secondary | ICD-10-CM | POA: Diagnosis not present

## 2024-01-25 DIAGNOSIS — R29898 Other symptoms and signs involving the musculoskeletal system: Secondary | ICD-10-CM | POA: Diagnosis not present

## 2024-01-25 DIAGNOSIS — M24111 Other articular cartilage disorders, right shoulder: Secondary | ICD-10-CM | POA: Diagnosis not present

## 2024-01-25 DIAGNOSIS — M542 Cervicalgia: Secondary | ICD-10-CM | POA: Diagnosis not present

## 2024-01-25 DIAGNOSIS — M25511 Pain in right shoulder: Secondary | ICD-10-CM | POA: Diagnosis not present

## 2024-01-25 DIAGNOSIS — M63811 Disorders of muscle in diseases classified elsewhere, right shoulder: Secondary | ICD-10-CM | POA: Diagnosis not present

## 2024-01-25 DIAGNOSIS — M19012 Primary osteoarthritis, left shoulder: Secondary | ICD-10-CM | POA: Diagnosis not present

## 2024-01-30 DIAGNOSIS — M542 Cervicalgia: Secondary | ICD-10-CM | POA: Diagnosis not present

## 2024-01-30 DIAGNOSIS — R29898 Other symptoms and signs involving the musculoskeletal system: Secondary | ICD-10-CM | POA: Diagnosis not present

## 2024-01-30 DIAGNOSIS — R278 Other lack of coordination: Secondary | ICD-10-CM | POA: Diagnosis not present

## 2024-01-30 DIAGNOSIS — M19012 Primary osteoarthritis, left shoulder: Secondary | ICD-10-CM | POA: Diagnosis not present

## 2024-01-30 DIAGNOSIS — M63811 Disorders of muscle in diseases classified elsewhere, right shoulder: Secondary | ICD-10-CM | POA: Diagnosis not present

## 2024-01-30 DIAGNOSIS — M24111 Other articular cartilage disorders, right shoulder: Secondary | ICD-10-CM | POA: Diagnosis not present

## 2024-01-30 DIAGNOSIS — M19011 Primary osteoarthritis, right shoulder: Secondary | ICD-10-CM | POA: Diagnosis not present

## 2024-01-30 DIAGNOSIS — M25511 Pain in right shoulder: Secondary | ICD-10-CM | POA: Diagnosis not present

## 2024-01-30 DIAGNOSIS — M25512 Pain in left shoulder: Secondary | ICD-10-CM | POA: Diagnosis not present

## 2024-02-08 DIAGNOSIS — R29898 Other symptoms and signs involving the musculoskeletal system: Secondary | ICD-10-CM | POA: Diagnosis not present

## 2024-02-08 DIAGNOSIS — R278 Other lack of coordination: Secondary | ICD-10-CM | POA: Diagnosis not present

## 2024-02-08 DIAGNOSIS — M25511 Pain in right shoulder: Secondary | ICD-10-CM | POA: Diagnosis not present

## 2024-02-08 DIAGNOSIS — M19011 Primary osteoarthritis, right shoulder: Secondary | ICD-10-CM | POA: Diagnosis not present

## 2024-02-08 DIAGNOSIS — M25512 Pain in left shoulder: Secondary | ICD-10-CM | POA: Diagnosis not present

## 2024-02-08 DIAGNOSIS — M24111 Other articular cartilage disorders, right shoulder: Secondary | ICD-10-CM | POA: Diagnosis not present

## 2024-02-08 DIAGNOSIS — M542 Cervicalgia: Secondary | ICD-10-CM | POA: Diagnosis not present

## 2024-02-08 DIAGNOSIS — M63811 Disorders of muscle in diseases classified elsewhere, right shoulder: Secondary | ICD-10-CM | POA: Diagnosis not present

## 2024-02-08 DIAGNOSIS — M19012 Primary osteoarthritis, left shoulder: Secondary | ICD-10-CM | POA: Diagnosis not present

## 2024-03-20 ENCOUNTER — Other Ambulatory Visit: Payer: Self-pay | Admitting: Internal Medicine

## 2024-03-20 DIAGNOSIS — Z1231 Encounter for screening mammogram for malignant neoplasm of breast: Secondary | ICD-10-CM

## 2024-03-27 NOTE — Progress Notes (Unsigned)
 " [facility] [address] [place] [address] Phone: [phone] Subjective:   Brooke [name] am a scribe for Dr. Claudene.   I'm seeing this patient by the request  of:  Dwight Trula SQUIBB, MD  CC: Right shoulder pain  [name]  12/23/2023 Given injection today and tolerated the procedure well, discussed icing regimen and home exercises, discussed which activities to do and which ones to avoid.  Increase activity slowly.  Discussed icing regimen.  Follow-up again in 6 to 12 weeks otherwise.     Updated 03/28/2024 Brooke [name] is a 84 y.o. female coming in with complaint of shoulder pain. Patient states that the finger is ok today. He fixed it. Right shoulder pain. Been with physical therapy for it for a long time. Not in OT anymore. When she wakes up in the moring the ROM is pretty restricted. If she does the doorway stretch it helps. Does yoga and other activities without issue. Not taking any pain medication. Wondering if this is going to be something that she has to live with from now on. The OT think it is bursitis in that shoulder causing the pain.       Past Medical History:  Diagnosis Date   Anxiety    Depression    no history of.   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   Saturday night palsy   Osteopenia    Rheumatic fever    no cardiology issues   Past Surgical History:  Procedure Laterality Date   CATARACT EXTRACTION Right    COLONOSCOPY WITH PROPOFOL  N/A 01/08/2014   Procedure: COLONOSCOPY WITH PROPOFOL ;  Surgeon: Gladis MARLA Louder, MD;  Location: [facility];  Service: Endoscopy;  Laterality: N/A;   PELVIC LAPAROSCOPY     [name]    TONSILLECTOMY     WRIST FRACTURE SURGERY Left    '11-hardware retained   Social History   Socioeconomic History   Marital status: Divorced    Spouse name: Not  on file   Number of children: Not on file   Years of education: Not on file   Highest education level: Not on file  Occupational History   Not on file  Tobacco Use   Smoking status: Never   Smokeless tobacco: Never  Substance and Sexual Activity   Alcohol use: Yes    Alcohol/week: 5.0 standard drinks of alcohol    Types: 5 Standard drinks or equivalent per week    Comment: 2 wine glasses per day.   Drug use: No   Sexual activity: Yes    Partners: Male    Comment: husband vasectomy  Other Topics Concern   Not on file  Social History Narrative   Not on file   Social Drivers of Health   Tobacco Use: Low Risk (12/23/2023)   Patient History    Smoking Tobacco Use: Never    Smokeless Tobacco Use: Never    Passive Exposure: Not on file  Financial Resource Strain: Not on file  Food Insecurity: Not on file  Transportation Needs: Not on file  Physical Activity: Not on file  Stress: Not on file  Social Connections: Not on file  Depression (EYV7-0): Not on file  Alcohol Screen: Not on file  Housing: Not on file  Utilities: Not on file  Health Literacy: Not  on file   Allergies[1] Family History  Problem Relation Age of Onset   Cancer Sister        ductal invasive cancer   Breast cancer Sister 12   Cancer Maternal Grandmother        colon   Cancer Father        lung (smoker)   Depression Sister    Osteoporosis Sister     Current Outpatient Medications (Endocrine & Metabolic):    denosumab  (PROLIA ) 60 MG/ML SOSY injection, Inject 60 mg into the skin every 6 (six) months.   predniSONE  (DELTASONE ) 20 MG tablet, Take 1 tablet (20 mg total) by mouth daily with breakfast.  Current Outpatient Medications (Cardiovascular):    rosuvastatin  (CRESTOR ) 5 MG tablet, Take 2 tablets (10 mg total) by mouth daily.  Current Outpatient Medications (Respiratory):    guaiFENesin (MUCINEX) 600 MG 12 hr tablet, Take 600 mg by mouth 2 (two) times daily as needed for cough.   loratadine  (CLARITIN) 10 MG tablet, Take 10 mg by mouth daily as needed for allergies.  Current Outpatient Medications (Analgesics):    naproxen sodium (ALEVE) 220 MG tablet, Take 220 mg by mouth 2 (two) times daily as needed (for pain).  Current Outpatient Medications (Other):    ALPRAZolam (XANAX) 0.5 MG tablet, Take 0.5 mg by mouth daily as needed for sleep.    calcium -vitamin D (OSCAL WITH D) 500-200 MG-UNIT tablet, Take 1 tablet by mouth 2 (two) times daily.   Cholecalciferol (VITAMIN D) 50 MCG (2000 UT) tablet, Take 2,000 Units by mouth daily.   clobetasol cream (TEMOVATE) 0.05 %, Apply 1 application topically 2 (two) times daily as needed (for rash).   diclofenac Sodium (VOLTAREN) 1 % GEL, Apply 2 g topically 4 (four) times daily as needed (for pain).   estradiol (ESTRACE) 0.1 MG/GM vaginal cream, Place 1 Applicatorful vaginally 2 (two) times a week.   Kelp 100 MG TABS, Take 100 mg by mouth daily.   melatonin 3 MG TABS tablet, Take 3 mg by mouth at bedtime.   Misc Natural Products (TART CHERRY ADVANCED PO), Take 1 tablet by mouth daily.   Turmeric 500 MG TABS, Take 500 mg by mouth daily.   Reviewed prior external information including notes and imaging from  primary care provider As well as notes that were available from care everywhere and other healthcare systems.  Past medical history, social, surgical and family history all reviewed in electronic medical record.  No pertanent information unless stated regarding to the chief complaint.   Review of Systems:  No headache, visual changes, nausea, vomiting, diarrhea, constipation, dizziness, abdominal pain, skin rash, fevers, chills, night sweats, weight loss, swollen lymph nodes, body aches, joint swelling, chest pain, shortness of breath, mood changes. POSITIVE muscle aches  Objective  Last menstrual period 03/08/1992.   General: No apparent distress alert and oriented x3 mood and affect normal, dressed appropriately.  HEENT: Pupils  equal, extraocular movements intact  Respiratory: Patient's speak in full sentences and does not appear short of breath  Cardiovascular: No lower extremity edema, non tender, no erythema  Arthritic changes of multiple joints.  Right shoulder though does show significant improvement in range of motion noted.  Patient has 4+ out of 5 strength and seems to be symmetric to the contralateral side.  Negative crossover noted.  Limited muscular skeletal ultrasound was performed and interpreted by [name], M  Limited ultrasound does not show any significant tearing of the rotator cuff but some degenerative changes.  Patient's acromioclavicular joint still shows arthritic changes but no significant hypoechoic changes that would be consistent with any acute swelling.  Does have some degenerative changes of the glenohumeral joint but also again no hypoechoic changes. Impression: Interval improvement    Impression and Recommendations:    The above documentation has been reviewed and is accurate and complete Savino Whisenant M Vineta Carone, DO        [1]  Allergies Allergen Reactions   Latex     Long period ... Red skin    "

## 2024-03-28 ENCOUNTER — Encounter: Payer: Self-pay | Admitting: Family Medicine

## 2024-03-28 ENCOUNTER — Ambulatory Visit: Admitting: Family Medicine

## 2024-03-28 ENCOUNTER — Other Ambulatory Visit: Payer: Self-pay

## 2024-03-28 VITALS — BP 130/70 | HR 93 | Ht 65.0 in

## 2024-03-28 DIAGNOSIS — G8929 Other chronic pain: Secondary | ICD-10-CM

## 2024-03-28 DIAGNOSIS — M25511 Pain in right shoulder: Secondary | ICD-10-CM

## 2024-03-28 NOTE — Patient Instructions (Signed)
Good to see you   See me again when you need me

## 2024-08-06 ENCOUNTER — Ambulatory Visit
